# Patient Record
Sex: Female | Born: 1950 | Race: White | Hispanic: No | Marital: Married | Smoking: Current every day smoker
Health system: Southern US, Community
[De-identification: ages and names within clinical notes are randomized; demographics above are authoritative.]

## PROBLEM LIST (undated history)

## (undated) DIAGNOSIS — I2699 Other pulmonary embolism without acute cor pulmonale: Secondary | ICD-10-CM

## (undated) DIAGNOSIS — E538 Deficiency of other specified B group vitamins: Secondary | ICD-10-CM

## (undated) DIAGNOSIS — T7840XA Allergy, unspecified, initial encounter: Secondary | ICD-10-CM

## (undated) DIAGNOSIS — D682 Hereditary deficiency of other clotting factors: Secondary | ICD-10-CM

## (undated) DIAGNOSIS — F419 Anxiety disorder, unspecified: Secondary | ICD-10-CM

## (undated) DIAGNOSIS — Z9109 Other allergy status, other than to drugs and biological substances: Secondary | ICD-10-CM

## (undated) DIAGNOSIS — I6523 Occlusion and stenosis of bilateral carotid arteries: Secondary | ICD-10-CM

## (undated) DIAGNOSIS — H409 Unspecified glaucoma: Secondary | ICD-10-CM

## (undated) DIAGNOSIS — K219 Gastro-esophageal reflux disease without esophagitis: Secondary | ICD-10-CM

## (undated) DIAGNOSIS — E785 Hyperlipidemia, unspecified: Secondary | ICD-10-CM

## (undated) DIAGNOSIS — E079 Disorder of thyroid, unspecified: Secondary | ICD-10-CM

## (undated) DIAGNOSIS — F32A Depression, unspecified: Secondary | ICD-10-CM

## (undated) DIAGNOSIS — F329 Major depressive disorder, single episode, unspecified: Secondary | ICD-10-CM

## (undated) DIAGNOSIS — I639 Cerebral infarction, unspecified: Secondary | ICD-10-CM

## (undated) DIAGNOSIS — D689 Coagulation defect, unspecified: Secondary | ICD-10-CM

## (undated) DIAGNOSIS — I6350 Cerebral infarction due to unspecified occlusion or stenosis of unspecified cerebral artery: Secondary | ICD-10-CM

## (undated) HISTORY — DX: Cerebral infarction, unspecified: I63.9

## (undated) HISTORY — DX: Anxiety disorder, unspecified: F41.9

## (undated) HISTORY — PX: TONSILLECTOMY: SUR1361

## (undated) HISTORY — PX: BONE MARROW BIOPSY: SHX199

## (undated) HISTORY — DX: Unspecified glaucoma: H40.9

## (undated) HISTORY — DX: Allergy, unspecified, initial encounter: T78.40XA

## (undated) HISTORY — PX: INTRAOCULAR LENS INSERTION: SHX110

## (undated) HISTORY — DX: Hereditary deficiency of other clotting factors: D68.2

## (undated) HISTORY — PX: BUNIONECTOMY: SHX129

## (undated) HISTORY — DX: Deficiency of other specified B group vitamins: E53.8

## (undated) HISTORY — DX: Gastro-esophageal reflux disease without esophagitis: K21.9

## (undated) HISTORY — DX: Occlusion and stenosis of bilateral carotid arteries: I65.23

## (undated) HISTORY — DX: Hyperlipidemia, unspecified: E78.5

## (undated) HISTORY — DX: Coagulation defect, unspecified: D68.9

## (undated) HISTORY — DX: Other pulmonary embolism without acute cor pulmonale: I26.99

## (undated) HISTORY — DX: Cerebral infarction due to unspecified occlusion or stenosis of unspecified cerebral artery: I63.50

---

## 2007-10-16 ENCOUNTER — Encounter: Payer: Self-pay | Admitting: Cardiology

## 2008-01-25 DIAGNOSIS — R413 Other amnesia: Secondary | ICD-10-CM | POA: Insufficient documentation

## 2010-07-21 ENCOUNTER — Ambulatory Visit
Admit: 2010-07-21 | Discharge: 2010-07-21 | Disposition: A | Discharge status: Home / Self Care | Payer: Self-pay | Source: Ambulatory Visit | Attending: Ophthalmology | Admitting: Ophthalmology

## 2010-07-21 LAB — COMPREHENSIVE METABOLIC PANEL
ALT: 23 U/L (ref 0–35)
AST: 24 U/L (ref 0–35)
Albumin: 4.6 g/dL (ref 3.5–5.2)
Alk Phos: 73 U/L (ref 35–105)
Anion Gap: 10 (ref 7–16)
Bilirubin,Total: 0.2 mg/dL (ref 0.0–1.2)
CO2: 29 mmol/L — ABNORMAL HIGH (ref 20–28)
Calcium: 9.5 mg/dL (ref 8.6–10.2)
Chloride: 101 mmol/L (ref 96–108)
Creatinine: 0.79 mg/dL (ref 0.51–0.95)
GFR,Black: >59 *
GFR,Caucasian: >59 *
Glucose: 126 mg/dL — ABNORMAL HIGH (ref 74–106)
Lab: 12 mg/dL (ref 6–20)
Potassium: 4.5 mmol/L (ref 3.3–5.1)
Sodium: 140 mmol/L (ref 133–145)
Total Protein: 6.9 g/dL (ref 6.3–7.7)

## 2010-07-21 LAB — CBC AND DIFFERENTIAL
Baso # K/uL: 0 THOU/uL (ref 0.0–0.1)
Basophil %: 0.3 % (ref 0.1–1.2)
Eos # K/uL: 0.1 THOU/uL (ref 0.0–0.4)
Eosinophil %: 1.1 % (ref 0.7–5.8)
Hematocrit: 41 % (ref 34–45)
Hemoglobin: 13.7 g/dL (ref 11.2–15.7)
Lymph # K/uL: 2.2 THOU/uL (ref 1.2–3.7)
Lymphocyte %: 29.1 % (ref 19.3–51.7)
MCV: 100 fL — ABNORMAL HIGH (ref 79–95)
Mono # K/uL: 0.6 THOU/uL (ref 0.2–0.9)
Monocyte %: 8.1 % (ref 4.7–12.5)
Neut # K/uL: 4.6 THOU/uL (ref 1.6–6.1)
Platelets: 261 THOU/uL (ref 160–370)
RBC: 4.1 MIL/uL (ref 3.9–5.2)
RDW: 14 % (ref 11.7–14.4)
Seg Neut %: 61.4 % (ref 34.0–71.1)
WBC: 7.5 THOU/uL (ref 4.0–10.0)

## 2010-07-21 LAB — URINALYSIS REFLEX TO CULTURE
Blood,UA: NEGATIVE
Ketones, UA: NEGATIVE
Leuk Esterase,UA: NEGATIVE
Nitrite,UA: NEGATIVE
Protein,UA: NEGATIVE mg/dL

## 2010-07-21 LAB — HIGH SENSITIVITY CRP: CRP,High Sensitivity: 1.9 mg/L

## 2010-07-21 LAB — URINALYSIS WITH REFLEX TO CULTURE
Specific Gravity,UA: 1.011 (ref 1.002–1.030)
pH,UA: 5.5 (ref 5.0–8.0)

## 2010-07-21 LAB — SEDIMENTATION RATE, AUTOMATED: Sedimentation Rate: 25 mm/h (ref 0–30)

## 2010-07-21 LAB — PROTIME-INR
INR: 2.4 — ABNORMAL HIGH (ref 0.9–1.1)
Protime: 26.6 s — ABNORMAL HIGH (ref 11.9–14.7)

## 2010-07-21 LAB — TSH: TSH: 3.59 u[IU]/mL (ref 0.27–4.20)

## 2010-07-21 LAB — CK: CK: 87 U/L (ref 34–145)

## 2010-07-21 LAB — APTT: aPTT: 51.8 s — ABNORMAL HIGH (ref 22.3–35.3)

## 2010-07-22 LAB — ANCA SCREEN: ANCA Screen: NEGATIVE

## 2010-07-22 LAB — ANA TITER/PATTERN: ANA Titer: 80 — AB

## 2010-07-22 LAB — RHEUMATOID FACTOR,SCREEN: Rheumatoid Factor: <10 IU/mL

## 2010-07-22 LAB — LYME IGG/IGM AB: Lyme AB Screen: NEGATIVE

## 2010-07-22 LAB — ANTINUCLEAR ANTIBODY SCREEN: ANA Screen: POSITIVE — AB

## 2010-07-22 LAB — ANTI-CARDIOLIPIN AB
Anticardiolipin IgG: 4 [GPL'U]/mL (ref 0–14)
Anticardiolipin IgM: 48 [MPL'U]/mL — ABNORMAL HIGH (ref 0–12)

## 2010-07-23 LAB — PROTEIN ELECTROPHORESIS, SERUM
A/G Ratio: 1.9 — ABNORMAL HIGH (ref 0.9–1.8)
Albumin %: 65.5 % (ref 55.3–68.5)
Albumin: 4.5 g/dL (ref 3.2–5.6)
Alpha 1 %: 2.3 % (ref 1.3–3.3)
Alpha 1: 0.2 g/dL (ref 0.1–0.3)
Alpha 2 %: 9.7 % (ref 7.7–13.7)
Alpha 2: 0.7 g/dL (ref 0.4–1.1)
Beta %: 11.4 % (ref 9.1–15.1)
Beta: 0.8 g/dL (ref 0.5–1.2)
Gamma %: 11.1 % (ref 8.6–17.8)
Gamma: 0.8 g/dL (ref 0.5–1.5)

## 2010-07-23 LAB — ANGIOTENSIN CONVERTING ENZYME: Angiotensin 1 Conv Enzyme: 44 U/L (ref 9–67)

## 2010-07-26 LAB — HSV 2 ANTIBODY, IGG: HSV 2 IgG: 0.25 IV

## 2010-07-26 LAB — C1Q BINDING IMMUNE COMPLEX: C1Q Binding: 3.3 ugE/mL (ref 0.0–3.9)

## 2010-07-26 LAB — HSV 1 ANTIBODY, IGG: HSV 1 IgG: 9.06 IV

## 2010-07-27 ENCOUNTER — Other Ambulatory Visit: Payer: Self-pay | Admitting: Ophthalmology

## 2010-08-03 ENCOUNTER — Ambulatory Visit
Admit: 2010-08-03 | Discharge: 2010-08-03 | Disposition: A | Discharge status: Home / Self Care | Payer: Self-pay | Source: Ambulatory Visit | Attending: Ophthalmology | Admitting: Ophthalmology

## 2010-08-04 LAB — INTERPRETATION,SPEC COAG

## 2010-08-04 LAB — LUPUS ANTICOAGULANT: Lupus Anticoagulant: NEGATIVE

## 2010-08-04 LAB — REVIEWED BY:

## 2010-08-05 LAB — PHOSPHATIDYLCHOLINE IGG/IGM/IGA
Phosphatidylcholine IgA: 2 U/mL (ref 0–18)
Phosphatidylcholine IgG: 0 U/mL (ref 0–18)
Phosphatidylcholine IgM: 1 U/mL (ref 0–18)

## 2010-08-07 LAB — PHOSPHATIDYLSERINE ANTIBODIES
Phos Serine IgA: >100 U/mL — ABNORMAL HIGH (ref 0–19)
Phos Serine IgG: 18 U/mL — ABNORMAL HIGH (ref 0–10)
Phos Serine IgM: 76 U/mL — ABNORMAL HIGH (ref 0–24)

## 2010-08-10 LAB — ANTI-CARDIOLIPIN AB
Anticardiolipin IgG: 6 [GPL'U]/mL (ref 0–14)
Anticardiolipin IgM: 66 [MPL'U]/mL — ABNORMAL HIGH (ref 0–12)

## 2010-09-15 ENCOUNTER — Ambulatory Visit: Payer: Self-pay

## 2010-09-15 VITALS — BP 152/75 | HR 89 | Temp 97.5°F | Resp 16 | Ht 64.57 in | Wt 138.9 lb

## 2010-09-15 DIAGNOSIS — I2699 Other pulmonary embolism without acute cor pulmonale: Secondary | ICD-10-CM

## 2010-09-17 NOTE — H&P (Signed)
 CC: management of hypercoagulability  HPI:  This is a  59 yo WM with suffered a PE and CVA about 2 years ago.  The patient recalls walking in the mall with her husband when one leg stopped responding normally. She had a scheduled appt with her PCP two days later.  When she went to that appointment, her PCP recommended emergency room evaluation (this was in [place]).  Her work up revealed bilateral PE's.  She was treated with heparin and coumadin.  She does not recall need ICU level care or thrombolytics.  During her hospitalization, it was thought that she had also developed a CVA as her balance was affected.  The patient has been treated with coumadin since that time without adverse event.    Ms. Ose was recently seen by a neuro-opthamologist for facial pain and rising intraocular pressure.  Given her h/o venous and arterial clots, antiphospholipid and anit-serine antibodies were checked.  Anticardiolipin IgM was positive at 48, then 66 on recheck two weeks later.  Her facial symptoms are probably related to zoster and have improved on antiviral medications.    The patient largely feels well.  She reports intermittent chest discomfort that seems to come on when lying flat, but otherwise denies chest pain, shortness of breath or hemoptysis.  She denies f/c/s, unintentional weight loss, abdominal pain, bleeding/bruising, arthralgias, myalgias, headaches or visual changes.    PMH:  Factor V Leiden (presumed heterozygote, but the patient is unsure)  Bone marrow bx 10 years ago showing low B12  H/o shingles    SHx  Married, lives with husband  Smokes  No significant etoh or illicit drug use  HRT use 10 years ago- her PCP discontinued this when her factor V leiden status was discovered      FHx  M-died suddenly, positive for factor V leiden (unknown whether hetero- or homozygous)  F-gastric cancer  Twin sister- also positive for factor V leiden (unknown whether hetero-or homozygous), no h/o clots  1 daughter-healthy,  negative for factor V leiden  1son-healthy, factor V status unknown    PEx  Gen: nad  HEENT: at/nc, no OP lesions  CV: rrr  PULM: ctab  Abd: s/nt/nd/+bs  Extr: no edema, no ttp    A/P  59 yo WF with two of three criteria for antiphospholipid antibody syndrome- a history of venous and arterial clots and a positive antiphospholipid antibody (the third criterion is persistence of the antibody x 12 weeks).  It is not known whether the antiphospholipid antibody was present at the time of her original clots or whether they were unrelated, however, the patient has tolerated anticoagulation without adverse event.  Also contributing to hypercoagulability is her factor V Leiden status, which, presuming she is a heterozygote, confers an relative risk of recurrent clots of 7 (the RR of homozygosity is 65).      Overall, given the presence of anticardiolipin antibodies, factor V leiden mutational status and h/o venous and arterial clots in the absence of major bleeding on coumadin, we would recommend that the patient continue anticoagulation.      Her symptoms of chest discomfort are concerning for right heart strain given her history of PE's and smoking.  We would recommend that she follow up with her PCP for further evaluation.    We would be happy to reevaluate the patient for any further questions or issues, but will not schedule a routine appointment.    Philbert Riser  Hematology fellow    CC:  Loel Lofty, MD  Felicie Morn, MD

## 2013-08-15 ENCOUNTER — Encounter (HOSPITAL_COMMUNITY): Payer: Self-pay | Admitting: Emergency Medicine

## 2013-08-15 ENCOUNTER — Emergency Department (HOSPITAL_COMMUNITY)
Admission: EM | Admit: 2013-08-15 | Discharge: 2013-08-15 | Disposition: A | Discharge status: Home / Self Care | Payer: BC Managed Care – PPO | Attending: Emergency Medicine | Admitting: Emergency Medicine

## 2013-08-15 DIAGNOSIS — E079 Disorder of thyroid, unspecified: Secondary | ICD-10-CM | POA: Insufficient documentation

## 2013-08-15 DIAGNOSIS — Z760 Encounter for issue of repeat prescription: Secondary | ICD-10-CM | POA: Insufficient documentation

## 2013-08-15 DIAGNOSIS — F411 Generalized anxiety disorder: Secondary | ICD-10-CM | POA: Insufficient documentation

## 2013-08-15 DIAGNOSIS — Z86711 Personal history of pulmonary embolism: Secondary | ICD-10-CM | POA: Insufficient documentation

## 2013-08-15 DIAGNOSIS — F329 Major depressive disorder, single episode, unspecified: Secondary | ICD-10-CM | POA: Insufficient documentation

## 2013-08-15 DIAGNOSIS — Z7901 Long term (current) use of anticoagulants: Secondary | ICD-10-CM | POA: Insufficient documentation

## 2013-08-15 DIAGNOSIS — Z79899 Other long term (current) drug therapy: Secondary | ICD-10-CM | POA: Insufficient documentation

## 2013-08-15 DIAGNOSIS — F17200 Nicotine dependence, unspecified, uncomplicated: Secondary | ICD-10-CM | POA: Insufficient documentation

## 2013-08-15 DIAGNOSIS — F3289 Other specified depressive episodes: Secondary | ICD-10-CM | POA: Insufficient documentation

## 2013-08-15 DIAGNOSIS — G479 Sleep disorder, unspecified: Secondary | ICD-10-CM | POA: Insufficient documentation

## 2013-08-15 HISTORY — DX: Disorder of thyroid, unspecified: E07.9

## 2013-08-15 HISTORY — DX: Depression, unspecified: F32.A

## 2013-08-15 HISTORY — DX: Major depressive disorder, single episode, unspecified: F32.9

## 2013-08-15 HISTORY — DX: Other pulmonary embolism without acute cor pulmonale: I26.99

## 2013-08-15 HISTORY — DX: Other allergy status, other than to drugs and biological substances: Z91.09

## 2013-08-15 MED ORDER — ALPRAZOLAM 0.5 MG PO TABS: 0.5000 mg | ORAL_TABLET | Freq: Two times a day (BID) | ORAL | Status: DC

## 2013-08-15 MED ORDER — ALPRAZOLAM 0.5 MG PO TABS
0.5000 mg | ORAL_TABLET | Freq: Once | ORAL | Status: AC
  Administered 2013-08-15: 0.5 mg via ORAL
  Filled 2013-08-15: qty 1

## 2013-08-15 NOTE — ED Notes (Signed)
Pt is out of alprazolam , feels shakey, nervous, cannot sleep.

## 2013-08-15 NOTE — ED Notes (Signed)
Pt states recently moved here from [place], and is currently out of her medication. Pt reports has [place] and said RX cannot be refilled by him. Pt has an appointment next week at a local MD. Pt states feels jittery, difficulty sleeping and anxious at this particular time. Said pt's twin sister is hospitalized for MI currently, and feels is under additional stress from moving, sisters MI and newly moving out of state. Denies SI/HI. NAD noted

## 2013-08-16 NOTE — ED Provider Notes (Signed)
CSN: [id number]     Arrival date & time 08/15/13  1332 History   First MD Initiated Contact with Patient 08/15/13 1357     Chief Complaint  Patient presents with  . Medication Refill   (Consider location/radiation/quality/duration/timing/severity/associated sxs/prior Treatment) HPI Comments: Emily Roberson is a 62 y.o. Female presenting for assistance with medication refill.  She has moved to [place] during the past month and has run out of her alprazolam which she takes for treatment of anxiety.  She spoke to her physician in [place] who informed her this prescription cannot be refilled by him.  She does have an appointment to establish care with one of the physicians at [facility] here next week.  In the interim,  Her last dose of alprazolam was taken 3 days ago.  She has felt progressively anxious, jittery, and has been difficulty sleeping,  Possibly compounded by the bad new of her twin sister currently being hospitalized with an acute MI.  She denies suicidal ideation, homicidal ideation.  She is currently ok with her other medicines.      The history is provided by the patient and a relative.    Past Medical History  Diagnosis Date  . Depression   . Thyroid disease   . PE (pulmonary embolism)   . Environmental allergies    Past Surgical History  Procedure Laterality Date  . Tonsillectomy    . Bunionectomy     History reviewed. No pertinent family history. History  Substance Use Topics  . Smoking status: Current Every Day Smoker  . Smokeless tobacco: Not on file  . Alcohol Use: No   OB History   Grav Para Term Preterm Abortions TAB SAB Ect Mult Living                 Review of Systems  Constitutional: Negative for fever and chills.  HENT: Negative for congestion and sore throat.   Eyes: Negative.   Respiratory: Negative for chest tightness and shortness of breath.   Cardiovascular: Negative for chest pain.  Gastrointestinal: Negative for nausea and abdominal pain.   Genitourinary: Negative.   Musculoskeletal: Negative for arthralgias, joint swelling and neck pain.  Skin: Negative.  Negative for rash and wound.  Neurological: Negative for dizziness, weakness, light-headedness, numbness and headaches.  Psychiatric/Behavioral: Positive for sleep disturbance. Negative for suicidal ideas and self-injury. The patient is nervous/anxious.     Allergies  Ceftin; Contrast media; and Statins  Home Medications   Current Outpatient Rx  Name  Route  Sig  Dispense  Refill  . ALPRAZolam (XANAX) 0.5 MG tablet   Oral   Take 0.5 mg by mouth 2 (two) times daily.         Marland Kitchen b complex vitamins tablet   Oral   Take 1 tablet by mouth daily.         Marland Kitchen escitalopram (LEXAPRO) 20 MG tablet   Oral   Take 20 mg by mouth daily.         Marland Kitchen latanoprost (XALATAN) 0.005 % ophthalmic solution   Right Eye   Place 1 drop into the right eye at bedtime.         Marland Kitchen levothyroxine (SYNTHROID, LEVOTHROID) 25 MCG tablet   Oral   Take 25 mcg by mouth daily before breakfast.         . montelukast (SINGULAIR) 10 MG tablet   Oral   Take 10 mg by mouth at bedtime.         Marland Kitchen  Rivaroxaban (XARELTO) 15 MG TABS tablet   Oral   Take 15 mg by mouth daily with supper.         . ALPRAZolam (XANAX) 0.5 MG tablet   Oral   Take 1 tablet (0.5 mg total) by mouth 2 (two) times daily.   15 tablet   0    BP 150/73  Pulse 84  Temp(Src) 99 F (37.2 C) (Oral)  Resp 20  Ht 5\' 2"  (1.575 m)  Wt 135 lb (61.236 kg)  BMI 24.69 kg/m2  SpO2 98% Physical Exam  Nursing note and vitals reviewed. Constitutional: She appears well-developed and well-nourished. No distress.  Slightly anxious appearing female in no distress.   HENT:  Head: Normocephalic and atraumatic.  Eyes: Conjunctivae are normal.  Neck: Normal range of motion.  Cardiovascular: Normal rate, regular rhythm, normal heart sounds and intact distal pulses.   Pulmonary/Chest: Effort normal and breath sounds normal. She  has no wheezes.  Abdominal: Soft. Bowel sounds are normal. There is no tenderness.  Musculoskeletal: Normal range of motion.  Neurological: She is alert.  Skin: Skin is warm and dry.  Psychiatric: Her speech is normal and behavior is normal. Judgment and thought content normal. Her mood appears anxious. Cognition and memory are normal. She expresses no homicidal and no suicidal ideation.    ED Course  Procedures (including critical care time) Labs Review Labs Reviewed - No data to display Imaging Review No results found.  EKG Interpretation   None       MDM   1. Medication refill    Pt prescribed alprazolam 0.5 mg bid #15.  Stressed need to establish with pcp next week as planned.  Pt agrees.  The patient appears reasonably screened and/or stabilized for discharge and I doubt any other medical condition or other [facility] requiring further screening, evaluation, or treatment in the ED at this time prior to discharge.     Burgess Amor, PA-C 08/16/13 2229

## 2013-08-20 ENCOUNTER — Other Ambulatory Visit: Payer: Self-pay | Admitting: *Deleted

## 2013-08-20 ENCOUNTER — Ambulatory Visit (INDEPENDENT_AMBULATORY_CARE_PROVIDER_SITE_OTHER): Payer: BC Managed Care – PPO | Admitting: Family Medicine

## 2013-08-20 VITALS — BP 120/62 | Temp 97.7°F | Ht 63.0 in | Wt 135.4 lb

## 2013-08-20 DIAGNOSIS — G250 Essential tremor: Secondary | ICD-10-CM | POA: Insufficient documentation

## 2013-08-20 DIAGNOSIS — E039 Hypothyroidism, unspecified: Secondary | ICD-10-CM

## 2013-08-20 DIAGNOSIS — F411 Generalized anxiety disorder: Secondary | ICD-10-CM

## 2013-08-20 DIAGNOSIS — E785 Hyperlipidemia, unspecified: Secondary | ICD-10-CM

## 2013-08-20 DIAGNOSIS — F329 Major depressive disorder, single episode, unspecified: Secondary | ICD-10-CM | POA: Insufficient documentation

## 2013-08-20 DIAGNOSIS — F32A Depression, unspecified: Secondary | ICD-10-CM | POA: Insufficient documentation

## 2013-08-20 DIAGNOSIS — H409 Unspecified glaucoma: Secondary | ICD-10-CM

## 2013-08-20 MED ORDER — ALPRAZOLAM 0.5 MG PO TABS: ORAL_TABLET | ORAL | Status: DC

## 2013-08-20 NOTE — Progress Notes (Signed)
Subjective:    Patient ID: Emily Roberson, female    DOB: 1951/03/27, 62 y.o.   MRN: [id number]  HPI Comments: Emily Roberson is a 62 y.o WF here as a new patient. She is from [facility] and has been here for 3 weeks.   She is here for refills on her anxiety medication. She went to the ED and they only gave her 15 tabs and told her to follow up with a PCP. She has been compliant with this medication. She denies any withdrawal symptoms and her last dose was a day ago. She says she has been weaning off of these and she use to take one tab 4 times a day but now only does twice a day. She says she's been doing well with this and would like to continue this taper if she could. She says she wasn't told about other options for her anxiety. She says she's always done what they told her to.  She has a PMH of bilateral PE's and CVA. She is on xarelto now for this. She was on warfarin but stopped this due to complications and frequent monitoring. She was also hard to control with therapeutic INR. She is doing well on this and has no signs of bleeding.   She also has a hx of depression and anxiety. She had a friend that died some years ago and  Had an episode where she was depressed and had phobias of leaving her  Home. Her prior PCP in [place] placed her on lexapro and xanax. She has been on these for years. She says she takes her xanax bid no matter what. She says this helps her and helps her sleep at night.   She also has hypothyroidism and is on synthroid daily. She has been on this dose for years but the last TSH reading went up from 2 to 3 in April and July 2014. She says she wasn't aware of this and never had her synthroid dose changed. She has been feeling tired and drained lately. She denies any constipation or heart palpitations. She does have some heat intolerance.   She has right eye glaucoma of which she is on xalatan for . She has been on this for years. She use to see an opthalmologist in [place] but doesn't see  any since being in [place].  She would like a referral to see  And be established with one here. She denies any problems with her eye sight and doesn't have any blurred vision.   She also has a hx of essential familial tremor and she hasn't been on anything for this. She says she was told that there's nothing they can do. She also says it doesn't occur all the time and that she only has a tremor when she's anxious or nervous about something.   PMH: PE bilateral 5 years ago, CVA, hypothyroidism, depression/anxiety, glaucoma Medications: see list Surgeries: none Allergies:  Ceftin, contrast, keflex, statins       Review of Systems  Constitutional: Positive for fatigue and unexpected weight change. Negative for diaphoresis, activity change and appetite change.  HENT: Negative for hearing loss, postnasal drip, sinus pressure and trouble swallowing.   Eyes: Negative for pain, redness and visual disturbance.  Respiratory: Negative for chest tightness, shortness of breath and wheezing.   Cardiovascular: Negative for chest pain and palpitations.  Gastrointestinal: Negative for nausea, vomiting, abdominal pain, diarrhea and constipation.  Endocrine: Positive for heat intolerance. Negative for cold intolerance, polydipsia and polyuria.  Genitourinary: Negative for dysuria, urgency, frequency, flank pain and pelvic pain.  Musculoskeletal: Negative for arthralgias and joint swelling.  Skin: Negative for color change and wound.  Allergic/Immunologic: Positive for environmental allergies. Negative for immunocompromised state.  Neurological: Negative for dizziness, speech difficulty, weakness, light-headedness, numbness and headaches.  Psychiatric/Behavioral: Negative for suicidal ideas, hallucinations, behavioral problems, sleep disturbance, self-injury and agitation. The patient is nervous/anxious.        Objective:   Physical Exam  Nursing note and vitals reviewed. Constitutional: She is oriented to  person, place, and time. She appears well-developed and well-nourished.  HENT:  Head: Normocephalic and atraumatic.  Right Ear: External ear normal.  Left Ear: External ear normal.  Mouth/Throat: Oropharynx is clear and moist.  Eyes: Conjunctivae are normal. Pupils are equal, round, and reactive to light.  Neck: Normal range of motion. Neck supple. No thyromegaly present.  Cardiovascular: Normal rate, regular rhythm, normal heart sounds and intact distal pulses.   Pulmonary/Chest: Effort normal and breath sounds normal. No respiratory distress. She has no wheezes. She exhibits no tenderness.  Abdominal: Soft. She exhibits no distension. There is no tenderness. There is no guarding.  Musculoskeletal: Normal range of motion. She exhibits no edema.  Neurological: She is alert and oriented to person, place, and time.  Skin: Skin is warm and dry.  Psychiatric: She has a normal mood and affect. Her behavior is normal. Thought content normal.      Assessment & Plan:  Emily Roberson was seen today for establish care.  Diagnoses and associated orders for this visit:  Generalized anxiety disorder - ALPRAZolam (XANAX) 0.5 MG tablet; Take one tab by mouth daily for 14 days.  Depression - Cancel: TSH + free T4; Future - Cancel: TSH + free T4  Familial tremor  Unspecified hypothyroidism - Cancel: TSH + free T4; Future - Cancel: TSH + free T4 - TSH; Future - T4, free; Future  HLD (hyperlipidemia)  Glaucoma - Ambulatory referral to Ophthalmology   although policy here is not to write chronic anxiolytics or chronic pain medications. The patient is wanting to try to wean off of this and since she's been on this for many years. I don't feel that it is safe to stop this altogether. Will go from xanax BID to qd and see how she does. Will see back in 2 weeks for follow up of this.  She has hypothyroidism and has been on the same dose of her synthroid for many years. She has been noted to have rising  TSH and will check this and increase dose of synthroid if indicated.   Refer to opthal for glaucoma. She is to continue drops for now.   Will get blood work again in Jan 2015 with her wellness. She just had her routine labs done Jan 2014.  She is noted to have HLD but diet may control this. The values weren't terribly elevated.   Tremor isn't noted today. HR is 80s and don't want to cause bradycardia so will hold off on BB for the treatment of her tremor for now.

## 2013-08-20 NOTE — Patient Instructions (Signed)
Temporomandibular Joint Pain Your exam shows that you have a problem with your temporomandibular joint (TMJ), the joint that moves when you open your mouth or chew food. TMJ problems can result from direct injuries, bite abnormalities, or tension states which cause you to grind or clench your teeth. Typical symptoms include pain around the joint, clicking, restricted movement, and headaches. The TMJ is like any other joint in the body; when it is strained, it needs rest to repair itself. To keep the joint at rest it is important that you do not open your mouth wider than the width of your index finger. If you must yawn, be sure to support your chin with your hand so your mouth does not open wide. Eat a soft diet (nothing firmer than ground beef, no raw vegetables), do not chew gum and do not talk if it causes you pain. Apply topical heat by using a warm, moist cloth placed in front of the ear for 15 to 20 minutes several times daily. Alternating heat and ice may give even more relief. Anti-inflammatory pain medicine and muscle relaxants can also be helpful. A dental orthotic or splint may be used for temporary relief. Long-term problems may require treatment for stress as well as braces or surgery. Please check with your doctor or dentist if your symptoms do not improve within one week. Document Released: 12/01/2004 Document Revised: 01/16/2012 Document Reviewed: 10/24/2005 [facility] Patient Information 2014 [place], [place]. Hypothyroidism The thyroid is a large gland located in the lower front of your neck. The thyroid gland helps control metabolism. Metabolism is how your body handles food. It controls metabolism with the hormone thyroxine. When this gland is underactive (hypothyroid), it produces too little hormone.  CAUSES These include:   Absence or destruction of thyroid tissue.  Goiter due to iodine deficiency.  Goiter due to medications.  Congenital defects (since birth).  Problems with the  pituitary. This causes a lack of TSH (thyroid stimulating hormone). This hormone tells the thyroid to turn out more hormone. SYMPTOMS  Lethargy (feeling as though you have no energy)  Cold intolerance  Weight gain (in spite of normal food intake)  Dry skin  Coarse hair  Menstrual irregularity (if severe, may lead to infertility)  Slowing of thought processes Cardiac problems are also caused by insufficient amounts of thyroid hormone. Hypothyroidism in the newborn is cretinism, and is an extreme form. It is important that this form be treated adequately and immediately or it will lead rapidly to retarded physical and mental development. DIAGNOSIS  To prove hypothyroidism, your caregiver may do blood tests and ultrasound tests. Sometimes the signs are hidden. It may be necessary for your caregiver to watch this illness with blood tests either before or after diagnosis and treatment. TREATMENT  Low levels of thyroid hormone are increased by using synthetic thyroid hormone. This is a safe, effective treatment. It usually takes about four weeks to gain the full effects of the medication. After you have the full effect of the medication, it will generally take another four weeks for problems to leave. Your caregiver may start you on low doses. If you have had heart problems the dose may be gradually increased. It is generally not an emergency to get rapidly to normal. HOME CARE INSTRUCTIONS   Take your medications as your caregiver suggests. Let your caregiver know of any medications you are taking or start taking. Your caregiver will help you with dosage schedules.  As your condition improves, your dosage needs may increase.  It will be necessary to have continuing blood tests as suggested by your caregiver.  Report all suspected medication side effects to your caregiver. SEEK MEDICAL CARE IF: Seek medical care if you develop:  Sweating.  Tremulousness (tremors).  Anxiety.  Rapid  weight loss.  Heat intolerance.  Emotional swings.  Diarrhea.  Weakness. SEEK IMMEDIATE MEDICAL CARE IF:  You develop chest pain, an irregular heart beat (palpitations), or a rapid heart beat. MAKE SURE YOU:   Understand these instructions.  Will watch your condition.  Will get help right away if you are not doing well or get worse. Document Released: 10/24/2005 Document Revised: 01/16/2012 Document Reviewed: 06/13/2008 [facility] Patient Information 2014 [place], [place].

## 2013-08-20 NOTE — ED Provider Notes (Signed)
Medical screening examination/treatment/procedure(s) were performed by non-physician practitioner and as supervising physician I was immediately available for consultation/collaboration.   Asar Evilsizer J Anie Juniel, MD 08/20/13 1620 

## 2013-08-21 ENCOUNTER — Encounter: Payer: Self-pay | Admitting: Family Medicine

## 2013-08-21 ENCOUNTER — Other Ambulatory Visit: Payer: Self-pay | Admitting: Family Medicine

## 2013-08-21 DIAGNOSIS — E039 Hypothyroidism, unspecified: Secondary | ICD-10-CM | POA: Insufficient documentation

## 2013-08-21 DIAGNOSIS — H409 Unspecified glaucoma: Secondary | ICD-10-CM | POA: Insufficient documentation

## 2013-08-21 MED ORDER — LEVOTHYROXINE SODIUM 50 MCG PO TABS: 50.0000 ug | ORAL_TABLET | Freq: Every day | ORAL | Status: DC

## 2013-08-21 NOTE — Progress Notes (Signed)
Spoke to patient and relayed results of TSH. Would like to go up to 50 mcg daily. She has 25 mcg #94 left and so I've instructed her to take 2 tabs daily of this. This would last her at least 45 days and that will be the time period where she willneed to return for repeat TSH. She voiced understanding and will also follow up in 2 weeks for anxiety medications. We are weaning her off the xanax

## 2013-09-03 ENCOUNTER — Encounter: Payer: Self-pay | Admitting: Family Medicine

## 2013-09-03 ENCOUNTER — Ambulatory Visit (INDEPENDENT_AMBULATORY_CARE_PROVIDER_SITE_OTHER): Payer: BC Managed Care – PPO | Admitting: Family Medicine

## 2013-09-03 VITALS — BP 120/66 | HR 95 | Temp 97.8°F | Resp 17 | Ht 63.0 in | Wt 136.5 lb

## 2013-09-03 DIAGNOSIS — M7041 Prepatellar bursitis, right knee: Secondary | ICD-10-CM

## 2013-09-03 DIAGNOSIS — Z86711 Personal history of pulmonary embolism: Secondary | ICD-10-CM

## 2013-09-03 DIAGNOSIS — E039 Hypothyroidism, unspecified: Secondary | ICD-10-CM

## 2013-09-03 DIAGNOSIS — D682 Hereditary deficiency of other clotting factors: Secondary | ICD-10-CM | POA: Insufficient documentation

## 2013-09-03 DIAGNOSIS — F411 Generalized anxiety disorder: Secondary | ICD-10-CM

## 2013-09-03 DIAGNOSIS — R760 Raised antibody titer: Secondary | ICD-10-CM

## 2013-09-03 DIAGNOSIS — R894 Abnormal immunological findings in specimens from other organs, systems and tissues: Secondary | ICD-10-CM

## 2013-09-03 DIAGNOSIS — M7040 Prepatellar bursitis, unspecified knee: Secondary | ICD-10-CM

## 2013-09-03 DIAGNOSIS — K219 Gastro-esophageal reflux disease without esophagitis: Secondary | ICD-10-CM

## 2013-09-03 DIAGNOSIS — Z8669 Personal history of other diseases of the nervous system and sense organs: Secondary | ICD-10-CM

## 2013-09-03 DIAGNOSIS — G500 Trigeminal neuralgia: Secondary | ICD-10-CM

## 2013-09-03 DIAGNOSIS — R42 Dizziness and giddiness: Secondary | ICD-10-CM

## 2013-09-03 LAB — BASIC METABOLIC PANEL
BUN: 11 mg/dL (ref 6–23)
CO2: 31 mEq/L (ref 19–32)
Chloride: 102 mEq/L (ref 96–112)
Creat: 0.78 mg/dL (ref 0.50–1.10)
Glucose, Bld: 89 mg/dL (ref 70–99)
Potassium: 4.9 mEq/L (ref 3.5–5.3)
Sodium: 140 mEq/L (ref 135–145)

## 2013-09-03 MED ORDER — ALPRAZOLAM 0.5 MG PO TABS: ORAL_TABLET | ORAL | Status: DC

## 2013-09-03 NOTE — Patient Instructions (Addendum)
Meniere's Disease  You have Meniere's disease. Meniere's disease is a term for the recurrent symptoms (problems) of vertigo (the room or you seem to spin around), tinnitus (ringing in the ears), and hearing loss. The cause is unknown. These episodes often come on suddenly and without warning. They are sometimes associated with nausea (feeling sick to your stomach) and vomiting. There is no cure. A number of strategies may help the symptoms. Surgical help is available if conservative measures fail.  HOME CARE INSTRUCTIONS   · If you smoke, STOP.  · Restrict your salt intake.  · Stop caffeine consumption (caffeinated coffee, sodas, and chocolate).  · Do not drive during or near the time of attacks.  · Over the counter Antivert® (meclizine®) at a 25 mg dosage 3 times per day may be helpful.  SEEK IMMEDIATE MEDICAL CARE IF:   · Nausea and vomiting are continuous.  · You have no relief from vertigo.  MAKE SURE YOU:   · Understand these instructions.  · Will watch your condition.  · Will get help right away if you are not doing well or get worse.  Document Released: 10/21/2000 Document Revised: 01/16/2012 Document Reviewed: 10/24/2005  [employer]® Patient Information ©2014 [employer].

## 2013-09-03 NOTE — Progress Notes (Signed)
Subjective:    Patient ID: Leanora Cover, female    DOB: Aug 17, 1951, 62 y.o.   MRN: [id number]  HPI Comments: Kerryann is a 62 y.o WF here for follow up. She was seen initially 2 weeks ago to establish care. She mentioned a hx of depression/anxiety, hypothyroidism, allergies, glaucoma, and familial tremor.   She had been on xanax for this anxiety.  She was being weaned off by me and is on xanax 0.5mg  daily for 2 weeks. She has been doing well since being on this. She only had to take 1 tab a day for her anxiety and says she's been doing well with this.   She has reported dizziness for the last few days. Mostly this is only when she was gardening and go up from the ground. She says they last for a few minutes. She explains that it's a whole body aura and she feels funny. SHe reports that she had lesions on her brain scan a year ago. She had a possible stroke due to left sided facial droop back in [place].  She also says she has ringing in her ears but no blurred vision. She reports some previous pain in her left temporal region and near her jaw. During the first initial visit, I suspected TMJ.  She never mentioned to me all these previous work ups of the complaints she reports today. Last labs were ESR 26 normal range in Dec 2013,Mag 2.2 normal range, CMP normal, MCV 100.1, H/H 13.6 and 39.9.  I've reviewed her notes from Neurology after her office visit today,  while she was in [place]. It states she has had chronic tinnitis and hx of trigeminal neuralgia. 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 Further follow ups mentioned continued jaw and facial pain with the ringing in her ears. They also mentioned that due to her factor v leiden deficiency and PE's along with CVA, she may have a PFO. Her bubble ECHO was  canceled but it was felt that identifying a PFO wouldn't change management. So she never got this ECHO.   She has hypothyroidism and had TSH which was high. I went up on her synthroid form to daily. She is to return in about 4 weeks for repeat TSH.        Review of Systems  Constitutional: Negative for fever, appetite change, fatigue and unexpected weight change.  HENT: Negative for rhinorrhea, sneezing, sore throat and voice change.   Eyes: Negative for redness and visual disturbance.  Respiratory: Negative for cough and wheezing.   Gastrointestinal: Negative for vomiting, abdominal pain, diarrhea, constipation and blood in stool.  Endocrine: Negative for cold intolerance, heat intolerance, polydipsia and polyuria.  Genitourinary: Negative for dysuria, flank pain and difficulty urinating.  Musculoskeletal: Negative for gait problem and joint swelling.  Neurological: Negative for seizures, syncope, weakness, numbness and headaches.  Psychiatric/Behavioral: Negative for sleep disturbance and dysphoric mood.       Objective:   Physical Exam  Nursing note and vitals reviewed. Constitutional: She is oriented to person, place, and time. She appears well-developed and well-nourished.  Resting head tremor  HENT:  Head: Normocephalic and atraumatic.  Right Ear: External ear normal.  Left Ear: External ear normal.  Nose: Nose normal.  Mouth/Throat: Oropharynx is clear and moist.  Cardiovascular: Normal rate,  regular rhythm, normal heart sounds and intact distal pulses.   Orthostatic vital signs normal  Pulmonary/Chest: Effort normal and breath sounds normal. No respiratory distress. She has no wheezes.  Abdominal: Soft. Bowel sounds are normal. She exhibits no distension. There is no tenderness.  Musculoskeletal: Normal range of motion.  Some left hand grip strength decreased to 4/5 compared to 5/5 on the right  Neurological: She is alert and oriented to person, place, and  time. She has normal reflexes. No cranial nerve deficit. She exhibits normal muscle tone.  Skin: Skin is warm and dry. No rash noted. No erythema.  Psychiatric: She has a normal mood and affect. Her behavior is normal. Judgment and thought content normal.      Assessment & Plan:  Bernisha was seen today for follow-up and anxiety.  Diagnoses and associated orders for this visit:  Generalized anxiety disorder - ALPRAZolam (XANAX) 0.5 MG tablet; Take one tab by mouth daily as needed for anxiety  Postural dizziness - Orthostatic vital signs - Sedimentation Rate; Future - C-reactive protein; Future - Basic metabolic panel; Future - Sedimentation Rate - C-reactive protein - Basic metabolic panel  Unspecified hypothyroidism  Trigeminal neuralgia  H/O tinnitus  GERD (gastroesophageal reflux disease)  Hx pulmonary embolism  Factor V deficiency  Anticardiolipin antibody positive  Prepatellar bursitis of right knee  -after review of the previous work up by neurology, will repeat ESR.CRP and if changed from previous value, may warrant further investigation. Recent MRI and CT scan negative.  Orthostatic vital signs are normal. Dizziness and tinnitis was first thought of from possible meneire's dz but after review, it appears that this is chronic in nature.  -will continue xanax daily prn and continue to wean off. Xanax #30 given today with no refills.  -follow up in 4 weeks and will have repeat TSH at that visit.   -other medical issues updated in chart based on prior records.

## 2013-09-04 ENCOUNTER — Encounter: Payer: Self-pay | Admitting: Family Medicine

## 2013-09-12 ENCOUNTER — Other Ambulatory Visit: Payer: Self-pay

## 2013-09-17 ENCOUNTER — Encounter: Payer: Self-pay | Admitting: Family Medicine

## 2013-09-17 ENCOUNTER — Ambulatory Visit (INDEPENDENT_AMBULATORY_CARE_PROVIDER_SITE_OTHER): Payer: BC Managed Care – PPO | Admitting: Family Medicine

## 2013-09-17 VITALS — BP 116/68 | HR 98 | Temp 98.8°F | Resp 20 | Ht 62.0 in | Wt 135.2 lb

## 2013-09-17 DIAGNOSIS — Z720 Tobacco use: Secondary | ICD-10-CM

## 2013-09-17 DIAGNOSIS — F17200 Nicotine dependence, unspecified, uncomplicated: Secondary | ICD-10-CM

## 2013-09-17 DIAGNOSIS — R05 Cough: Secondary | ICD-10-CM

## 2013-09-17 DIAGNOSIS — F411 Generalized anxiety disorder: Secondary | ICD-10-CM

## 2013-09-17 DIAGNOSIS — F4310 Post-traumatic stress disorder, unspecified: Secondary | ICD-10-CM | POA: Insufficient documentation

## 2013-09-17 DIAGNOSIS — E039 Hypothyroidism, unspecified: Secondary | ICD-10-CM

## 2013-09-17 MED ORDER — ALPRAZOLAM 0.5 MG PO TABS: ORAL_TABLET | ORAL | Status: DC

## 2013-09-17 MED ORDER — BENZONATATE 100 MG PO CAPS: 100.0000 mg | ORAL_CAPSULE | Freq: Three times a day (TID) | ORAL | Status: AC | PRN
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
## 2013-09-17 NOTE — Patient Instructions (Signed)
Posttraumatic Stress Disorder   Posttraumatic stress disorder (PTSD) is a mental disorder. It occurs after a traumatic event in your life. The traumatic events that cause PTSD are outside the range of normal human experience. Examples of these events include war, automobile accidents, natural disasters, rape, domestic violence, and violent crimes. Most people who experience these types of events are able to heal on their own. Those who do not heal develop PTSD. PTSD can happen to anyone at any age. However, people with a history of childhood abuse are at increased risk for developing PTSD.   SYMPTOMS   The traumatic event that causes PTSD must be a threat to life, cause serious injury, or involve sexual violence. The traumatic event is usually experienced directly by the person who develops PTSD. Sometimes PTSD occurs in people who witness traumas that occur to others or who hear about a trauma that occurs to a close family member or friend. The following behaviors are characteristic of people with PTSD:   People with PTSD re-experience the traumatic event in one or more of the following ways (intrusion symptoms):   Recurrent, unwanted distressing memories while awake.   Recurrent distressing dreams.   Sensations similar to those felt when the event originally occurred (flashbacks).   Intense or prolonged emotional distress, triggered by reminders of the trauma. This may include fear, horror, intense sadness, or anger.   Marked physical reactions, triggered by reminders of the trauma. This may include racing heart, shortness of breath, sweating, and shaking.   People with PTSD avoid thoughts, conversations, people, or activities that remind them of the traumatic event (avoidance symptoms).   People with PTSD have negative changes in their thinking and mood after the traumatic event. These changes include:   Inability to remember one or more significant aspects of the traumatic event (memory gaps).   Exaggerated  negative perceptions about themselves or others, such as believing that they are bad people or that no one can be trusted.   Unrealistic assignment of blame to themselves or others for the traumatic event.   Persistent negative emotional state, such as fear, horror, anger, sadness, guilt, or shame.   Markedly decreased interest or participation in significant activities.   A loss of connection with other people.   Inability to experience positive emotions, such as happiness or love.   People with PTSD are more sensitive to their environment and react more easily than others (hyperarousal overreactivity symptoms). These symptoms include:   Irritability, with angry outbursts toward other people or objects. The outbursts are easily triggered and may be verbal or physical.   Careless or self-destructive behavior. This may include reckless driving or drug use.   A feeling of being on edge, with increased alertness (hypervigilance).   Exaggerated reactions to stimuli, such as being easily startled.   Difficulty concentrating.   Difficulty sleeping.  PTSD symptoms may start soon after a frightening event or months or years later. They last at least 1 month or longer and can affect one or more areas of functioning, such as social or occupational functioning.   DIAGNOSIS   PTSD is diagnosed through an assessment by a mental health professional. You will be asked questions about the traumatic events in your life. You will also be asked about how these events have changed your thoughts, mood, behavior, and ability to function on a daily basis. You may be asked about your use of alcohol or drugs, which can make PTSD symptoms worse.   TREATMENT     Unlike many mental disorders, which require life-long management, PTSD is a curable condition. The goal of PTSD treatment is to neutralize the negative effects of the traumatic event on daily functioning, not erase the memory of the event. The following treatments may be prescribed to  reach this goal:   Medication Certain medications can reduce some PTSD symptoms. Intrusion symptoms and hyperarousal overactivity symptoms respond best to medications.   Counseling (talk therapy) Talk therapy with a mental health professional who is experienced in treating PTSD can help. Talk therapy can provide education, emotional support, and coping skills. Certain types of talk therapy that specifically target the traumatic events are the most effective treatment for PTSD:   Prolonged exposure therapy, which involves remembering and processing the traumatic event with a therapist in a safe environment until it no longer creates a negative emotional response.   Eye movement desensitization and reprocessing therapy, which involves the use of repetitive physical stimulation of the senses that alternates between the right and left sides of the body. It is believed that this therapy facilitates communication between the two sides of the brain. This communication helps the mind to integrate the fragmented memories of the traumatic event into a whole story that makes sense and no longer creates a negative emotional response.  Most people with PTSD benefit from a combination of these treatments.   Document Released: 07/19/2001 Document Revised: 02/18/2013 Document Reviewed: 01/10/2013   [employer]® Patient Information ©2014 [employer].

## 2013-09-18 ENCOUNTER — Encounter: Payer: Self-pay | Admitting: Family Medicine

## 2013-10-01 LAB — T4, FREE: Free T4: 1 ng/dL (ref 0.80–1.80)

## 2013-10-01 LAB — TSH: TSH: 1.811 u[IU]/mL (ref 0.350–4.500)

## 2013-10-02 ENCOUNTER — Encounter: Payer: Self-pay | Admitting: Family Medicine

## 2013-10-07 ENCOUNTER — Encounter: Payer: Self-pay | Admitting: Family Medicine

## 2013-10-07 NOTE — Telephone Encounter (Signed)
If you could get these records for me to review and we can investigate further. There are a few muscle heart enzymes that are present so this is a nonspecific finding. This is why I need these records and we can go to the next step with stress test if indicated based on these values.

## 2013-10-08 ENCOUNTER — Other Ambulatory Visit: Payer: Self-pay | Admitting: Family Medicine

## 2013-10-08 DIAGNOSIS — D682 Hereditary deficiency of other clotting factors: Secondary | ICD-10-CM

## 2013-10-08 DIAGNOSIS — R079 Chest pain, unspecified: Secondary | ICD-10-CM

## 2013-10-08 DIAGNOSIS — Z86711 Personal history of pulmonary embolism: Secondary | ICD-10-CM

## 2013-10-08 MED ORDER — RANITIDINE HCL 150 MG PO TABS: 150.0000 mg | ORAL_TABLET | Freq: Two times a day (BID) | ORAL | Status: AC

## 2013-10-08 NOTE — Progress Notes (Signed)
Reviewed [facility] hospital report of chest pain admission 10/04/13. EKG negative and enzymes negative x 3. Recommended outpatient stress test.  Will order. Also the patient did get some relief with Gi cocktail and will add zantac as well.

## 2013-10-08 NOTE — Progress Notes (Signed)
Spoke with Pt, notified her we called in Zantac.  Scheduled her for Stress Test 10/15/13 at 12:30 [facility]

## 2013-10-14 ENCOUNTER — Encounter: Payer: Self-pay | Admitting: Family Medicine

## 2013-11-05 ENCOUNTER — Encounter: Payer: Self-pay | Admitting: Family Medicine

## 2013-11-20 ENCOUNTER — Encounter: Payer: Self-pay | Admitting: Family Medicine

## 2013-11-21 ENCOUNTER — Other Ambulatory Visit: Payer: Self-pay | Admitting: *Deleted

## 2013-11-21 MED ORDER — LEVOTHYROXINE SODIUM 50 MCG PO TABS: 50.0000 ug | ORAL_TABLET | Freq: Every day | ORAL | Status: DC

## 2013-11-25 ENCOUNTER — Encounter: Payer: Self-pay | Admitting: Family Medicine

## 2013-11-29 ENCOUNTER — Ambulatory Visit (INDEPENDENT_AMBULATORY_CARE_PROVIDER_SITE_OTHER): Payer: BC Managed Care – PPO | Admitting: Family Medicine

## 2013-11-29 ENCOUNTER — Encounter: Payer: Self-pay | Admitting: Family Medicine

## 2013-11-29 VITALS — BP 128/60 | HR 110 | Temp 98.2°F | Resp 20 | Ht 62.0 in | Wt 135.2 lb

## 2013-11-29 DIAGNOSIS — F4310 Post-traumatic stress disorder, unspecified: Secondary | ICD-10-CM

## 2013-11-29 DIAGNOSIS — D6859 Other primary thrombophilia: Secondary | ICD-10-CM

## 2013-11-29 DIAGNOSIS — E039 Hypothyroidism, unspecified: Secondary | ICD-10-CM

## 2013-11-29 DIAGNOSIS — R42 Dizziness and giddiness: Secondary | ICD-10-CM

## 2013-11-29 DIAGNOSIS — Z8669 Personal history of other diseases of the nervous system and sense organs: Secondary | ICD-10-CM

## 2013-11-29 DIAGNOSIS — F411 Generalized anxiety disorder: Secondary | ICD-10-CM | POA: Insufficient documentation

## 2013-11-29 DIAGNOSIS — Z5181 Encounter for therapeutic drug level monitoring: Secondary | ICD-10-CM

## 2013-11-29 DIAGNOSIS — Z0000 Encounter for general adult medical examination without abnormal findings: Secondary | ICD-10-CM

## 2013-11-29 DIAGNOSIS — D6861 Antiphospholipid syndrome: Secondary | ICD-10-CM

## 2013-11-29 MED ORDER — MECLIZINE HCL 12.5 MG PO TABS: 12.5000 mg | ORAL_TABLET | Freq: Three times a day (TID) | ORAL | Status: AC | PRN

## 2013-11-29 MED ORDER — ALPRAZOLAM 0.5 MG PO TABS: ORAL_TABLET | ORAL | Status: DC

## 2013-11-29 NOTE — Patient Instructions (Signed)
Meclizine tablets or capsules What is this medicine? MECLIZINE (MEK li zeen) is an antihistamine. It is used to prevent nausea, vomiting, or dizziness caused by motion sickness. It is also used to prevent and treat vertigo (extreme dizziness or a feeling that you or your surroundings are tilting or spinning around). This medicine may be used for other purposes; ask your health care provider or pharmacist if you have questions. COMMON BRAND NAME(S): Antivert, Dramamine Less Drowsy, Medivert, Meni-D  What should I tell my health care provider before I take this medicine? They need to know if you have any of these conditions: -asthma -glaucoma -prostate trouble -stomach problems -urinary problems -an unusual or allergic reaction to meclizine, other medicines, foods, dyes, or preservatives -pregnant or trying to get pregnant -breast-feeding How should I use this medicine? Take this medicine by mouth with a glass of water. Follow the directions on the prescription label. If you are using this medicine to prevent motion sickness, take the dose at least 1 hour before travel. If it upsets your stomach, take it with food or milk. Take your doses at regular intervals. Do not take your medicine more often than directed. Talk to your pediatrician regarding the use of this medicine in children. Special care may be needed. Overdosage: If you think you have taken too much of this medicine contact a poison control center or emergency room at once. NOTE: This medicine is only for you. Do not share this medicine with others. What if I miss a dose? If you miss a dose, take it as soon as you can. If it is almost time for your next dose, take only that dose. Do not take double or extra doses. What may interact with this medicine? -barbiturate medicines for inducing sleep or treating seizures -digoxin -medicines for anxiety or sleeping problems, like alprazolam, diazepam or temazepam -medicines for hay fever and  other allergies -medicines for mental depression -medicines for movement abnormalities as in Parkinson's disease, or for stomach problems -medicines for pain -medicines that relax muscles This list may not describe all possible interactions. Give your health care provider a list of all the medicines, herbs, non-prescription drugs, or dietary supplements you use. Also tell them if you smoke, drink alcohol, or use illegal drugs. Some items may interact with your medicine. What should I watch for while using this medicine? If you are taking this medicine on a regular schedule, visit your doctor or health care professional for regular checks on your progress. You may get dizzy, drowsy or have blurred vision. Do not drive, use machinery, or do anything that needs mental alertness until you know how this medicine affects you. Do not stand or sit up quickly, especially if you are an older patient. This reduces the risk of dizzy or fainting spells. Alcohol can increase possible dizziness. Avoid alcoholic drinks. Your mouth may get dry. Chewing sugarless gum or sucking hard candy, and drinking plenty of water may help. Contact your doctor if the problem does not go away or is severe. This medicine may cause dry eyes and blurred vision. If you wear contact lenses you may feel some discomfort. Lubricating drops may help. See your eye doctor if the problem does not go away or is severe. What side effects may I notice from receiving this medicine? Side effects that you should report to your doctor or health care professional as soon as possible: -fainting spells -fast or irregular heartbeat Side effects that usually do not require medical attention (report to  your doctor or health care professional if they continue or are bothersome): -constipation -difficulty passing urine -difficulty sleeping -headache -stomach upset This list may not describe all possible side effects. Call your doctor for medical advice  about side effects. You may report side effects to FDA at 1-800-FDA-1088. Where should I keep my medicine? Keep out of the reach of children. Store at room temperature between 15 and 30 degrees C (59 and 86 degrees F). Keep container tightly closed. Throw away any unused medicine after the expiration date. NOTE: This sheet is a summary. It may not cover all possible information. If you have questions about this medicine, talk to your doctor, pharmacist, or health care provider.  2014, [employer]vier/Gold Standard. (2008-05-01 10:35:36) Vertigo Vertigo means you feel like you or your surroundings are moving when they are not. Vertigo can be dangerous if it occurs when you are at work, driving, or performing difficult activities.  CAUSES  Vertigo occurs when there is a conflict of signals sent to your brain from the visual and sensory systems in your body. There are many different causes of vertigo, including:  Infections, especially in the inner ear.  A bad reaction to a drug or misuse of alcohol and medicines.  Withdrawal from drugs or alcohol.  Rapidly changing positions, such as lying down or rolling over in bed.  A migraine headache.  Decreased blood flow to the brain.  Increased pressure in the brain from a head injury, infection, tumor, or bleeding. SYMPTOMS  You may feel as though the world is spinning around or you are falling to the ground. Because your balance is upset, vertigo can cause nausea and vomiting. You may have involuntary eye movements (nystagmus). DIAGNOSIS  Vertigo is usually diagnosed by physical exam. If the cause of your vertigo is unknown, your caregiver may perform imaging tests, such as an MRI scan (magnetic resonance imaging). TREATMENT  Most cases of vertigo resolve on their own, without treatment. Depending on the cause, your caregiver may prescribe certain medicines. If your vertigo is related to body position issues, your caregiver may recommend movements or  procedures to correct the problem. In rare cases, if your vertigo is caused by certain inner ear problems, you may need surgery. HOME CARE INSTRUCTIONS   Follow your caregiver's instructions.  Avoid driving.  Avoid operating heavy machinery.  Avoid performing any tasks that would be dangerous to you or others during a vertigo episode.  Tell your caregiver if you notice that certain medicines seem to be causing your vertigo. Some of the medicines used to treat vertigo episodes can actually make them worse in some people. SEEK IMMEDIATE MEDICAL CARE IF:   Your medicines do not relieve your vertigo or are making it worse.  You develop problems with talking, walking, weakness, or using your arms, hands, or legs.  You develop severe headaches.  Your nausea or vomiting continues or gets worse.  You develop visual changes.  A family member notices behavioral changes.  Your condition gets worse. MAKE SURE YOU:  Understand these instructions.  Will watch your condition.  Will get help right away if you are not doing well or get worse. Document Released: 08/03/2005 Document Revised: 01/16/2012 Document Reviewed: 05/12/2011 [facility] Patient Information 2014 [place], [place]. Dizziness Dizziness is a common problem. It is a feeling of unsteadiness or lightheadedness. You may feel like you are about to faint. Dizziness can lead to injury if you stumble or fall. A person of any age group can suffer from dizziness,  but dizziness is more common in older adults. CAUSES  Dizziness can be caused by many different things, including:  Middle ear problems.  Standing for too long.  Infections.  An allergic reaction.  Aging.  An emotional response to something, such as the sight of blood.  Side effects of medicines.  Fatigue.  Problems with circulation or blood pressure.  Excess use of alcohol, medicines, or illegal drug use.  Breathing too fast (hyperventilation).  An  arrhythmia or problems with your heart rhythm.  Low red blood cell count (anemia).  Pregnancy.  Vomiting, diarrhea, fever, or other illnesses that cause dehydration.  Diseases or conditions such as Parkinson's disease, high blood pressure (hypertension), diabetes, and thyroid problems.  Exposure to extreme heat. DIAGNOSIS  To find the cause of your dizziness, your caregiver may do a physical exam, lab tests, radiologic imaging scans, or an electrocardiography test (ECG).  TREATMENT  Treatment of dizziness depends on the cause of your symptoms and can vary greatly. HOME CARE INSTRUCTIONS   Drink enough fluids to keep your urine clear or pale yellow. This is especially important in very hot weather. In the elderly, it is also important in cold weather.  If your dizziness is caused by medicines, take them exactly as directed. When taking blood pressure medicines, it is especially important to get up slowly.  Rise slowly from chairs and steady yourself until you feel okay.  In the morning, first sit up on the side of the bed. When this seems okay, stand slowly while holding onto something until you know your balance is fine.  If you need to stand in one place for a long time, be sure to move your legs often. Tighten and relax the muscles in your legs while standing.  If dizziness continues to be a problem, have someone stay with you for a day or two. Do this until you feel you are well enough to stay alone. Have the person call your caregiver if he or she notices changes in you that are concerning.  Do not drive or use heavy machinery if you feel dizzy.  Do not drink alcohol. SEEK IMMEDIATE MEDICAL CARE IF:   Your dizziness or lightheadedness gets worse.  You feel nauseous or vomit.  You develop problems with talking, walking, weakness, or using your arms, hands, or legs.  You are not thinking clearly or you have difficulty forming sentences. It may take a friend or family member  to determine if your thinking is normal.  You develop chest pain, abdominal pain, shortness of breath, or sweating.  Your vision changes.  You notice any bleeding.  You have side effects from medicine that seems to be getting worse rather than better. MAKE SURE YOU:   Understand these instructions.  Will watch your condition.  Will get help right away if you are not doing well or get worse. Document Released: 04/19/2001 Document Revised: 01/16/2012 Document Reviewed: 05/13/2011 [facility] Patient Information 2014 [place], [place].

## 2013-11-29 NOTE — Progress Notes (Signed)
Subjective:     Emily Roberson is a 63 y.o. female who presents for annual exam.  She particularly has complaints of dizziness. The symptoms started 4 days ago and are unchanged. She also has a wristling sound and last a few minutes. The attacks occur intermittently and last 5 minutes. Positions that worsen symptoms: any motion. Previous workup/treatments: blood work: negative and MRI of the brain. The MRI was reviewed and was negative but patient says she was told there were some spots on the MRI. Associated ear symptoms: tinnitus. Associated CNS symptoms: none. Recent infections: none. Head trauma: denied. Drug ingestion: sedatives and patient is on xanax prn for anxiety. Noise exposure: no occupational exposure. Family history: non-contributory.  The following portions of the patient's history were reviewed and updated as appropriate:  She  has a past medical history of Depression; Thyroid disease; PE (pulmonary embolism); Environmental allergies; Allergy; Anxiety; GERD (gastroesophageal reflux disease); Glaucoma; Hyperlipidemia; Stroke; Factor V deficiency; and B12 deficiency. She  does not have any pertinent problems on file. She  has past surgical history that includes Tonsillectomy; Bunionectomy; Bone marrow biopsy; and Intraocular lens insertion (Bilateral). Her family history includes Cancer in her father and maternal grandmother; Heart disease in her mother. She  reports that she has been smoking.  She does not have any smokeless tobacco history on file. She reports that she does not drink alcohol or use illicit drugs. Current Outpatient Prescriptions on File Prior to Visit  Medication Sig Dispense Refill  . b complex vitamins tablet Take 1 tablet by mouth daily.      . benzonatate (TESSALON PERLES) 100 MG capsule Take 1 capsule (100 mg total) by mouth 3 (three) times daily as needed for cough.  20 capsule  0  . escitalopram (LEXAPRO) 20 MG tablet Take 20 mg by mouth daily.      Marland Kitchen  latanoprost (XALATAN) 0.005 % ophthalmic solution Place 1 drop into the right eye at bedtime.      Marland Kitchen levothyroxine (SYNTHROID, LEVOTHROID) 50 MCG tablet Take 1 tablet (50 mcg total) by mouth daily.  90 tablet  0  . montelukast (SINGULAIR) 10 MG tablet Take 10 mg by mouth at bedtime.      . ranitidine (ZANTAC) 150 MG tablet Take 1 tablet (150 mg total) by mouth 2 (two) times daily.  60 tablet  1  . Rivaroxaban (XARELTO) 15 MG TABS tablet Take 15 mg by mouth daily with supper.       No current facility-administered medications on file prior to visit.   She is allergic to ceftin; contrast media; keflex; peanuts; shellfish allergy; and statins..  Review of Systems Pertinent items are noted in HPI.    Objective:    BP 128/60  Pulse 110  Temp(Src) 98.2 F (36.8 C) (Temporal)  Resp 20  Ht 5' 2" (1.575 m)  Wt 135 lb 4 oz (61.349 kg)  BMI 24.73 kg/m2  SpO2 97% General appearance: alert, cooperative, appears stated age, no distress and resting essential tremor Head: Normocephalic, without obvious abnormality, atraumatic, sinuses nontender to percussion Eyes: conjunctivae/corneas clear. PERRL, EOM's intact. Fundi benign. Ears: normal TM's and external ear canals both ears and negative Dix Hallpike Nose: Nares normal. Septum midline. Mucosa normal. No drainage or sinus tenderness. Throat: lips, mucosa, and tongue normal; teeth and gums normal Neck: no JVD, supple, symmetrical, trachea midline and thyroid not enlarged, symmetric, no tenderness/mass/nodules Lungs: clear to auscultation bilaterally and normal percussion bilaterally Heart: tachycardia Abdomen: soft, non-tender; bowel sounds normal; no  masses,  no organomegaly Extremities: extremities normal, atraumatic, no cyanosis or edema Pulses: 2+ and symmetric Skin: Skin color, texture, turgor normal. No rashes or lesions Lymph nodes: Cervical, supraclavicular, and axillary nodes normal. Neurologic: Alert and oriented X 3, normal strength  and tone. Normal symmetric reflexes. Normal coordination and gait      Assessment:    Dizziness, suspect Meniere's Disease    Emily Roberson was seen today for annual exam and dizziness.  Diagnoses and associated orders for this visit:  Visit for preventive health examination  Anxiety state, unspecified - ALPRAZolam (XANAX) 0.5 MG tablet; Take one tab by mouth at bedtime as needed for anxiety  PTSD (post-traumatic stress disorder) - ALPRAZolam (XANAX) 0.5 MG tablet; Take one tab by mouth at bedtime as needed for anxiety  Dizziness and giddiness - Sedimentation Rate; Future - C-reactive protein; Future - meclizine (ANTIVERT) 12.5 MG tablet; Take 1 tablet (12.5 mg total) by mouth 3 (three) times daily as needed for dizziness. - Sedimentation Rate - C-reactive protein  H/O tinnitus - Sedimentation Rate; Future - C-reactive protein; Future - meclizine (ANTIVERT) 12.5 MG tablet; Take 1 tablet (12.5 mg total) by mouth 3 (three) times daily as needed for dizziness. - Sedimentation Rate - C-reactive protein  Unspecified hypothyroidism - TSH; Future - T4, free; Future - Basic metabolic panel; Future - CBC with Differential; Future - TSH - T4, free - Basic metabolic panel - CBC with Differential  Anticardiolipin syndrome - TSH; Future - T4, free; Future - Basic metabolic panel; Future - CBC with Differential; Future - Lipid Panel; Future - TSH - T4, free - Basic metabolic panel - CBC with Differential - Lipid Panel  Medication monitoring encounter - CBC with Differential; Future - CBC with Differential Plan:    Meclizine per medication orders. Decongestants per medication orders. The nature of vertigo syndromes were discussed along with their usual course and treatment. Educational materials given and questions answered. will get labs to include ESR/CRP. WIth hx of vertigo and tinnitis and patient was on meclizine at one point with relief of symptoms, will add this  medicine back to her treatment regimen.  To follow up pending lab results. Xanax reordered and to see psychiatry as scheduled for further treatment options of psychiatric symptoms i.e anxiety for the best treatment regimen.

## 2013-12-02 ENCOUNTER — Other Ambulatory Visit: Payer: Self-pay | Admitting: Family Medicine

## 2013-12-02 ENCOUNTER — Encounter: Payer: Self-pay | Admitting: Family Medicine

## 2013-12-02 DIAGNOSIS — Z5181 Encounter for therapeutic drug level monitoring: Secondary | ICD-10-CM | POA: Insufficient documentation

## 2013-12-02 DIAGNOSIS — D6861 Antiphospholipid syndrome: Secondary | ICD-10-CM | POA: Insufficient documentation

## 2013-12-02 DIAGNOSIS — Z0000 Encounter for general adult medical examination without abnormal findings: Secondary | ICD-10-CM | POA: Insufficient documentation

## 2013-12-02 LAB — LIPID PANEL
CHOLESTEROL: 230 mg/dL — AB (ref 0–200)
HDL: 40 mg/dL (ref >39)
LDL CALC: 152 mg/dL — AB (ref 0–99)
TRIGLYCERIDES: 191 mg/dL — AB (ref <150)
Total CHOL/HDL Ratio: 5.8 Ratio
VLDL: 38 mg/dL (ref 0–40)

## 2013-12-02 LAB — CBC WITH DIFFERENTIAL/PLATELET
BASOS ABS: 0 10*3/uL (ref 0.0–0.1)
BASOS PCT: 0 % (ref 0–1)
Eosinophils Absolute: 0.1 10*3/uL (ref 0.0–0.7)
Eosinophils Relative: 1 % (ref 0–5)
HEMATOCRIT: 43.2 % (ref 36.0–46.0)
Hemoglobin: 15.1 g/dL — ABNORMAL HIGH (ref 12.0–15.0)
LYMPHS PCT: 23 % (ref 12–46)
Lymphs Abs: 1.5 10*3/uL (ref 0.7–4.0)
MCH: 33.4 pg (ref 26.0–34.0)
MCHC: 35 g/dL (ref 30.0–36.0)
MCV: 95.6 fL (ref 78.0–100.0)
MONO ABS: 0.5 10*3/uL (ref 0.1–1.0)
Monocytes Relative: 7 % (ref 3–12)
NEUTROS ABS: 4.6 10*3/uL (ref 1.7–7.7)
NEUTROS PCT: 69 % (ref 43–77)
PLATELETS: 345 10*3/uL (ref 150–400)
RBC: 4.52 MIL/uL (ref 3.87–5.11)
RDW: 13.9 % (ref 11.5–15.5)
WBC: 6.7 10*3/uL (ref 4.0–10.5)

## 2013-12-02 LAB — TSH: TSH: 2.242 u[IU]/mL (ref 0.350–4.500)

## 2013-12-02 LAB — BASIC METABOLIC PANEL
BUN: 12 mg/dL (ref 6–23)
CO2: 31 meq/L (ref 19–32)
CREATININE: 0.78 mg/dL (ref 0.50–1.10)
Calcium: 9.8 mg/dL (ref 8.4–10.5)
Chloride: 102 mEq/L (ref 96–112)
GLUCOSE: 96 mg/dL (ref 70–99)
Potassium: 4.9 mEq/L (ref 3.5–5.3)
SODIUM: 141 meq/L (ref 135–145)

## 2013-12-02 LAB — T4, FREE: Free T4: 1.04 ng/dL (ref 0.80–1.80)

## 2013-12-02 LAB — C-REACTIVE PROTEIN: CRP: <0.5 mg/dL (ref <0.60)

## 2013-12-03 ENCOUNTER — Encounter: Payer: Self-pay | Admitting: Family Medicine

## 2013-12-03 ENCOUNTER — Telehealth: Payer: Self-pay | Admitting: Family Medicine

## 2013-12-03 ENCOUNTER — Other Ambulatory Visit: Payer: Self-pay | Admitting: Family Medicine

## 2013-12-03 DIAGNOSIS — Z91041 Radiographic dye allergy status: Secondary | ICD-10-CM

## 2013-12-03 DIAGNOSIS — J309 Allergic rhinitis, unspecified: Secondary | ICD-10-CM

## 2013-12-03 DIAGNOSIS — I6523 Occlusion and stenosis of bilateral carotid arteries: Secondary | ICD-10-CM

## 2013-12-03 DIAGNOSIS — D6861 Antiphospholipid syndrome: Secondary | ICD-10-CM

## 2013-12-03 DIAGNOSIS — E785 Hyperlipidemia, unspecified: Secondary | ICD-10-CM

## 2013-12-03 DIAGNOSIS — Z8669 Personal history of other diseases of the nervous system and sense organs: Secondary | ICD-10-CM

## 2013-12-03 DIAGNOSIS — Z516 Encounter for desensitization to allergens: Secondary | ICD-10-CM

## 2013-12-03 DIAGNOSIS — D682 Hereditary deficiency of other clotting factors: Secondary | ICD-10-CM

## 2013-12-03 DIAGNOSIS — R42 Dizziness and giddiness: Secondary | ICD-10-CM

## 2013-12-03 HISTORY — DX: Occlusion and stenosis of bilateral carotid arteries: I65.23

## 2013-12-03 LAB — C-REACTIVE PROTEIN: CRP: <0.5 mg/dL (ref <0.60)

## 2013-12-03 LAB — SEDIMENTATION RATE: Sed Rate: 5 mm/hr (ref 0–22)

## 2013-12-03 MED ORDER — MONTELUKAST SODIUM 10 MG PO TABS: 10.0000 mg | ORAL_TABLET | Freq: Every day | ORAL | Status: AC

## 2013-12-03 MED ORDER — FENOFIBRATE 145 MG PO TABS: 145.0000 mg | ORAL_TABLET | Freq: Every day | ORAL | Status: DC

## 2013-12-03 MED ORDER — DIPHENHYDRAMINE HCL 50 MG PO TABS: ORAL_TABLET | ORAL | Status: AC

## 2013-12-03 MED ORDER — PREDNISONE 50 MG PO TABS
ORAL_TABLET | ORAL | Status: AC
Start: 2013-12-03 — End: ?

## 2013-12-03 MED ORDER — FENOFIBRATE 120 MG PO TABS: 120.0000 mg | ORAL_TABLET | Freq: Every day | ORAL | Status: DC

## 2013-12-03 NOTE — Telephone Encounter (Signed)
Have spoken to husband about labs and will send labs at home as well for their review. As discussed during the office visit, will get BMP first to assure normal renal function before doing MRA head and neck. 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  She has worsening dizziness, leaning toward the left side, tinnitis of the left ear>right ear, and some numbness to left side of body in the last 3-4 days; that comes and goes. She has been on xarelto for years. Scan needed to evaluate for vertebral artery syndrome, TIA, or steal syndrome.   Will need to pretreat with Prednisone 50 mg at 13 hours, 7 hours, and 1 hour prior to study. Also with Benadryl 50 mg x 1 dose at 1 hour prior to study. Patient given written instructions in lab letter.

## 2013-12-04 ENCOUNTER — Encounter: Payer: Self-pay | Admitting: Family Medicine

## 2013-12-09 ENCOUNTER — Encounter (HOSPITAL_COMMUNITY): Payer: Self-pay

## 2013-12-09 ENCOUNTER — Ambulatory Visit (HOSPITAL_COMMUNITY)
Admission: RE | Admit: 2013-12-09 | Discharge: 2013-12-09 | Disposition: A | Discharge status: Home / Self Care | Payer: BC Managed Care – PPO | Source: Ambulatory Visit | Attending: Family Medicine | Admitting: Family Medicine

## 2013-12-09 DIAGNOSIS — I6523 Occlusion and stenosis of bilateral carotid arteries: Secondary | ICD-10-CM

## 2013-12-09 DIAGNOSIS — R42 Dizziness and giddiness: Secondary | ICD-10-CM

## 2013-12-09 DIAGNOSIS — Q283 Other malformations of cerebral vessels: Secondary | ICD-10-CM | POA: Insufficient documentation

## 2013-12-09 DIAGNOSIS — D682 Hereditary deficiency of other clotting factors: Secondary | ICD-10-CM

## 2013-12-09 DIAGNOSIS — Z8669 Personal history of other diseases of the nervous system and sense organs: Secondary | ICD-10-CM

## 2013-12-09 DIAGNOSIS — H9319 Tinnitus, unspecified ear: Secondary | ICD-10-CM | POA: Insufficient documentation

## 2013-12-09 DIAGNOSIS — R209 Unspecified disturbances of skin sensation: Secondary | ICD-10-CM | POA: Insufficient documentation

## 2013-12-09 DIAGNOSIS — E785 Hyperlipidemia, unspecified: Secondary | ICD-10-CM

## 2013-12-09 DIAGNOSIS — D6861 Antiphospholipid syndrome: Secondary | ICD-10-CM

## 2013-12-09 MED ORDER — GADOBENATE DIMEGLUMINE 529 MG/ML IV SOLN
15.0000 mL | Freq: Once | INTRAVENOUS | Status: AC | PRN
  Administered 2013-12-09: 15 mL via INTRAVENOUS

## 2013-12-10 ENCOUNTER — Encounter: Payer: Self-pay | Admitting: Family Medicine

## 2013-12-11 ENCOUNTER — Encounter: Payer: Self-pay | Admitting: Family Medicine

## 2013-12-23 ENCOUNTER — Encounter: Payer: Self-pay | Admitting: Family Medicine

## 2013-12-30 ENCOUNTER — Encounter: Payer: Self-pay | Admitting: Family Medicine

## 2014-01-02 ENCOUNTER — Encounter: Payer: Self-pay | Admitting: Family Medicine

## 2014-01-27 ENCOUNTER — Encounter: Payer: Self-pay | Admitting: Family Medicine

## 2014-01-29 ENCOUNTER — Encounter: Payer: Self-pay | Admitting: Family Medicine

## 2014-02-02 ENCOUNTER — Other Ambulatory Visit: Payer: Self-pay | Admitting: Family Medicine

## 2014-02-03 NOTE — Telephone Encounter (Signed)
Pt needs a refill on [facility]

## 2014-02-06 ENCOUNTER — Encounter: Payer: Self-pay | Admitting: Family Medicine

## 2014-02-06 ENCOUNTER — Ambulatory Visit (INDEPENDENT_AMBULATORY_CARE_PROVIDER_SITE_OTHER): Payer: BC Managed Care – PPO | Admitting: Family Medicine

## 2014-02-06 VITALS — BP 100/60 | HR 72 | Temp 97.8°F | Resp 18 | Ht 63.5 in | Wt 132.4 lb

## 2014-02-06 DIAGNOSIS — F4310 Post-traumatic stress disorder, unspecified: Secondary | ICD-10-CM

## 2014-02-06 DIAGNOSIS — F411 Generalized anxiety disorder: Secondary | ICD-10-CM

## 2014-02-06 DIAGNOSIS — E039 Hypothyroidism, unspecified: Secondary | ICD-10-CM

## 2014-02-06 DIAGNOSIS — E785 Hyperlipidemia, unspecified: Secondary | ICD-10-CM

## 2014-02-06 LAB — LIPID PANEL
Cholesterol: 177 mg/dL (ref 0–200)
HDL: 47 mg/dL (ref >39)
LDL Cholesterol: 112 mg/dL — ABNORMAL HIGH (ref 0–99)
TRIGLYCERIDES: 92 mg/dL (ref <150)
Total CHOL/HDL Ratio: 3.8 Ratio
VLDL: 18 mg/dL (ref 0–40)

## 2014-02-06 LAB — T4, FREE: FREE T4: 1.14 ng/dL (ref 0.80–1.80)

## 2014-02-06 LAB — TSH: TSH: 2.216 u[IU]/mL (ref 0.350–4.500)

## 2014-02-06 MED ORDER — FENOFIBRATE 120 MG PO TABS: 120.0000 mg | ORAL_TABLET | Freq: Every day | ORAL | Status: DC

## 2014-02-06 MED ORDER — ESCITALOPRAM OXALATE 20 MG PO TABS: 20.0000 mg | ORAL_TABLET | Freq: Every day | ORAL | Status: AC

## 2014-02-06 MED ORDER — ALPRAZOLAM 0.5 MG PO TABS: ORAL_TABLET | ORAL | Status: AC

## 2014-02-06 NOTE — Progress Notes (Signed)
Subjective:     Patient ID: Emily Roberson, female   DOB: 09/29/1951, 63 y.o.   MRN: [id number]  Hyperlipidemia This is a chronic problem. The current episode started more than 1 year ago. The problem is controlled. Exacerbating diseases include hypothyroidism. She has no history of chronic renal disease. Factors aggravating her hyperlipidemia include smoking. Pertinent negatives include no focal sensory loss, focal weakness, myalgias or shortness of breath. Current antihyperlipidemic treatment includes fibric acid derivatives. There are no compliance problems.  Risk factors for coronary artery disease include stress and family history.  Anxiety Presents for follow-up visit. Symptoms include excessive worry, nervous/anxious behavior and restlessness. Patient reports no decreased concentration, dizziness or shortness of breath. Symptoms occur occasionally. The severity of symptoms is moderate. The symptoms are aggravated by family issues ([place]oved here from [place] and would like to get back). The quality of sleep is fair. Nighttime awakenings: occasional.   Her past medical history is significant for anxiety/panic attacks. Past treatments include benzodiazephines. The treatment provided significant relief. Compliance with prior treatments has been good.     Review of Systems  Respiratory: Negative for shortness of breath.   Musculoskeletal: Negative for myalgias.  Neurological: Negative for dizziness and focal weakness.  Psychiatric/Behavioral: Negative for decreased concentration. The patient is nervous/anxious.        Objective:   Physical Exam  Nursing note and vitals reviewed. Constitutional: She appears well-developed and well-nourished.  HENT:  Head: Normocephalic and atraumatic.  Right Ear: External ear normal.  Left Ear: External ear normal.  Cardiovascular: Normal rate, regular rhythm and normal heart sounds.   Pulmonary/Chest: Effort normal and breath sounds normal.  Skin: Skin is warm  and dry.  Psychiatric: She has a normal mood and affect. Her behavior is normal.       Assessment:     Emily Roberson was seen today for hyperlipidemia and medication refill.  Diagnoses and associated orders for this visit:  HLD (hyperlipidemia) - Lipid Panel - Fenofibrate 120 MG TABS; Take 1 tablet (120 mg total) by mouth daily.  Anxiety state, unspecified - ALPRAZolam (XANAX) 0.5 MG tablet; Take one tab by mouth daily and bedtime as needed for anxiety  Unspecified hypothyroidism - TSH - T4, free  PTSD (post-traumatic stress disorder) - ALPRAZolam (XANAX) 0.5 MG tablet; Take one tab by mouth daily and bedtime as needed for anxiety  Other Orders - escitalopram (LEXAPRO) 20 MG tablet; Take 1 tablet (20 mg total) by mouth daily.       Plan:     Refilled medicines and getting lipid panel and repeat TSH.

## 2014-02-07 ENCOUNTER — Encounter: Payer: Self-pay | Admitting: Family Medicine

## 2014-02-07 ENCOUNTER — Other Ambulatory Visit: Payer: Self-pay | Admitting: Family Medicine

## 2014-02-07 MED ORDER — LEVOTHYROXINE SODIUM 50 MCG PO TABS: 50.0000 ug | ORAL_TABLET | Freq: Every day | ORAL | Status: AC

## 2014-02-11 ENCOUNTER — Other Ambulatory Visit: Payer: Self-pay | Admitting: Family Medicine

## 2014-02-11 DIAGNOSIS — E785 Hyperlipidemia, unspecified: Secondary | ICD-10-CM

## 2014-02-11 MED ORDER — FENOFIBRATE 145 MG PO TABS: 145.0000 mg | ORAL_TABLET | Freq: Every day | ORAL | Status: DC

## 2014-02-11 MED ORDER — FENOFIBRATE 145 MG PO TABS: 145.0000 mg | ORAL_TABLET | Freq: Every day | ORAL | Status: AC

## 2017-05-29 ENCOUNTER — Encounter: Payer: Self-pay | Admitting: Gastroenterology

## 2017-07-19 ENCOUNTER — Encounter: Payer: Self-pay | Admitting: Gastroenterology

## 2017-08-08 ENCOUNTER — Ambulatory Visit: Payer: Medicare (Managed Care) | Attending: Medical Oncology | Admitting: Medical Oncology

## 2017-08-08 VITALS — BP 124/58 | HR 80 | Temp 97.5°F | Wt 136.3 lb

## 2017-08-08 DIAGNOSIS — D6851 Activated protein C resistance: Secondary | ICD-10-CM | POA: Insufficient documentation

## 2017-08-08 NOTE — Progress Notes (Signed)
Dear Dr. Darrol Angel,    I had the pleasure of seeing Ms. Jhane Lorio,  in consultation today at [facility] and [facility] in [place]. Please allow me to reiterate her history for our mutual records.    History of Present Illness   Ms. Wisener is a 66 y.o. female who had bilateral pulmonary embolism and CVA in 2009. She was initially treated with heparin and eventually switched to Coumadin.  She continues to take Coumadin since that time without any adverse event and according to the patient her PT has been therapeutic.  In the past she was seen by a neuro-ophthalmologist for facial pain and rising intraocular pressure.  Her workup also included anti-phospholipid and anti-serine antibodies.  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  According to the patient she was also positive for factor V Leiden mutation but does not know if she was heterozygous or homozygous.  She was recently seen by her neuro-ophthalmologist and more workup was initiated for hypercoagulation.  She has been referred here for further evaluation.  Her mother died suddenly, positive for factor V Leiden.  Twin sister positive for factor V Leiden.  One daughter healthy and negative for factor V Leiden mutation.  One son 84 years old and has not been tested.  One cousin from maternal side also had factor V Leiden mutation.     On direct interview she admits of having a fair appetite and her weight is stable. Her energy level is fair and she denies any unusual fatigue. She denies any obvious bleeding and has not felt any abnormal lump. She has not been recently started on new medications, there has not been any recent infection, ER visit or hospitalization.     Review of Systems   Review of Systems   Constitutional: Negative for chills, fever, malaise/fatigue and weight loss.   HENT: Negative for ear discharge, ear pain, hearing loss, nosebleeds and tinnitus.    Eyes: Negative for blurred vision, double vision, pain and discharge.   Respiratory:  Negative for cough, hemoptysis, sputum production and shortness of breath.    Cardiovascular: Negative for chest pain and palpitations.   Gastrointestinal: Negative for abdominal pain, constipation, diarrhea, heartburn, melena, nausea and vomiting.   Genitourinary: Negative for dysuria, frequency and hematuria.   Musculoskeletal: Negative for back pain, joint pain, myalgias and neck pain.   Skin: Negative for itching and rash.   Neurological: Negative for dizziness, tingling, weakness and headaches.   Endo/Heme/Allergies: Does not bruise/bleed easily.   Psychiatric/Behavioral: Negative for depression and hallucinations.   All other systems reviewed and are negative.    Pertinent Past History   MEDICAL HISTORY  Past Medical History:   Diagnosis Date    Clotting disorder     Unspecified cerebral artery occlusion with cerebral infarction      SURGICAL HISTORY  Past Surgical History:   Procedure Laterality Date    BUNIONECTOMY  2004    Right foot    TONSILLECTOMY AND ADENOIDECTOMY  1972     SOCIAL HISTORY  Social History     Social History    Marital status: Married     Spouse name: N/A    Number of children: N/A    Years of education: N/A     Occupational History    Not on file.     Social History Main Topics    Smoking status: Not on file    Smokeless tobacco: Not on file    Alcohol use Not on file  Drug use: Not on file    Sexual activity: Not on file     Social History Narrative    No narrative on file       FAMILY HISTORY  Family History   Problem Relation Age of Onset    Clotting disorder Mother     Cancer Father     Hypertension Father     Diabetes Father     Cancer Maternal Grandmother        Vitals Signs   BP 124/58   Pulse 80   Temp 36.4 C (97.5 F) (Temporal)    Wt 61.8 kg (136 lb 4.8 oz)   SpO2 97%   BMI 22.99 kg/m2    Problem List     Patient Active Problem List   Diagnosis Code    Memory Lapses Or Loss R41.3       Current Medications     Prior to Admission medications    Medication  Sig Start Date End Date Taking? Authorizing Provider   ALERT CONVERSION COMPLETE  Provider must reconcile medications 02/15/11   Provider, Conversion   alprazolam (XANAX) 0.5 MG tablet Take 0.5 mg by mouth 2 times daily.    [provider]   escitalopram (LEXAPRO) 20 MG tablet Take 20 mg by mouth daily.    [provider]   montelukast (SINGULAIR) 10 MG tablet Take 10 mg by mouth nightly.    [provider]   warfarin (COUMADIN) 5 MG tablet Take 5 mg by mouth daily. 7.5mg  M-F,  Sat and Sun     [provider]   Cyanocobalamin (VITAMIN B 12 PO) Take  by mouth.    [provider]   carbamazepine (TEGRETOL) 200 MG tablet Take 100 mg by mouth daily.    [provider]   valacyclovir (VALTREX) 500 MG tablet Take 500 mg by mouth daily.    [provider]   aspirin 81 MG tablet  01/25/08   Provider, Conversion   Omega-3 Fatty Acids (FISH OIL) 1360 MG CAPS  01/25/08   Provider, Conversion   Flaxseed, Linseed, (FLAXSEED OIL) 1200 MG CAPS  01/25/08   Provider, Conversion   ezetimibe (ZETIA) 10 MG tablet  01/25/08   Provider, Conversion   ALPRAZolam Prudy Feeler) 2 MG tablet  01/25/08   Provider, Conversion   escitalopram (LEXAPRO) 5 MG tablet  01/25/08   Provider, Conversion   risedronate (ACTONEL) 150 MG tablet TAKE 1 TABLET DAILY once per month 08/28/07   Provider, Conversion       Physical Exam   Physical Exam   Constitutional: She is oriented to person, place, and time. She appears well-developed and well-nourished.   HENT:   Head: Normocephalic and atraumatic.   Eyes: Conjunctivae are normal. Pupils are equal, round, and reactive to light.   Neck: Normal range of motion. Neck supple. No tracheal deviation present. No thyromegaly present.   Cardiovascular: Normal rate, regular rhythm and normal heart sounds.  Exam reveals no friction rub.    No murmur heard.  Pulmonary/Chest: Effort normal and breath sounds normal. No stridor. No respiratory distress. She has no  wheezes. She has no rales.   Abdominal: She exhibits no distension and no mass. There is no tenderness.   Musculoskeletal: Normal range of motion.   Neurological: She is alert and oriented to person, place, and time. No cranial nerve deficit.   Skin: Skin is warm and dry.   Psychiatric: She has a normal mood and affect. Her behavior  is normal. Judgment and thought content normal.      Lab Results    reviewed    Pathology Results       Imaging Results       Assessment   Ms. Bothwell a 66 y.o. female with a past history of bilateral pulmonary embolism and CVA in 2009 and she continues to take Coumadin since that time without any adverse event.  She has a very strong family history of factor V Leiden mutation and it is unclear at this point if she is heterozygous or homozygous.  Her previous workup by her  neuropathologist had also revealed anti-phospholipid antibody.  We discussed only a small percentage of individuals with FVL with heterozygosity develop DVT in the lifetime and approximate risk is 4-5% but it can go up to 20% in thrombophilic families.  On the other hand risk is far greater in the setting of homozygosity or compound heterozygosity for FVL.  It can sometimes also involve cerebral, portal and hepatic veins.      Decision regarding indefinite anticoagulation is also individualized.  Considering her strong family history of FVL (at this point unclear if homozygous or heterozygous), her past medical history of bilateral pulmonary embolism and CVA.  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      Clinically she is asymptomatic and has a good performance status.  Her physical examination did not reveal any obvious organomegaly or adenopathy. Final decision can be made once I obtain all her records and lab reports from her neuro-ophthalmologist. According to the patient she recently had more workup for hypercoagulation and  results are not available yet. If required will initiate further workup for hypercoagulation.     Counseling and education was provided. In total, I spent approximately 50 min. in consultations with the patient.  The majority of this time was spent in education, counseling and coordination of care.  During my encounter, I reviewed her history and overall evaluation also included laboratory data, in detail.  I specifically discussed hypercoagulation syndrome.  Ms. Rufo had several questions and all were addressed fully and seemingly to her satisfaction.    She will return to my clinic in 3 weeks to go over the results and further management plan unless there is any new hematology or oncology related problem.    Thank you once again for allowing me to participate in the care of Ms. Bruna. Should you have any questions, please feel free to contact me at any time.     Respectfully yours,      Author: Ok Anis, MD as of 08/08/2017 at 3:10 PM  Jaye Beagle, MD   Associate Professor, Hematology/Oncology  Division of Medicine

## 2017-08-22 NOTE — Progress Notes (Incomplete)
Reason for evaluation: Follow up for Factor V Leiden.    Performance Status: ECOG ***    Diagnosis and Treatment History:  Vicki Hancock a 66 y.o. female with a past history of bilateral pulmonary embolism and CVA in 2009 and she continues to take Coumadin since that time without any adverse event.  She has a very strong family history of factor V Leiden mutation and it is unclear at this point if she is heterozygous or homozygous.  Her previous workup by her  neuropathologist had also revealed anti-phospholipid antibody.     Current Treatment:  Coumadin    Interval History:       (pmh, psh, social history, current OP meds reviewed and up to date)    Review of Systems:  Remainder of 12 point ROS conducted and is negative. Pertinent positives as described above.    Vital Signs:  There were no vitals taken for this visit.    Wt Readings from Last 3 Encounters:   08/08/17 61.8 kg (136 lb 4.8 oz)   09/15/10 63 kg (138 lb 14.2 oz)   07/30/07 61.9 kg (136 lb 8 oz)     Physical Exam:  General: Sitting comfortably. No acute distress.  HEENT: Oral mucosa intact. No oral lesions. No thrush noted. Sclerae are anicteric. No jaundice. Pupils are equal and reactive to light.   Cardiovascular: S1 and S2 are audible. RRR without murmurs, rubs, or gallops appreciated.  Respiratory: Lungs are CTA bilaterally without wheezes, rhonchi, or rales.  Abdomen: Soft, non tender, non distended. Bowel sounds are normal.  Extremities: No peripheral edema. No calf tenderness on palpation.  Skin: No palmar/plantar erythema. No excessive drying.  Neurologic: alert and oriented. Normal affect.    Laboratory Review:  No results for input(s): WBC, HGB, HCT, RBC, PLT, ASEGR, ALYMR, AMONR, AEOSR, ABASR, SEGR, LYMPR, MONOR, EOSR, BASOR, BAND, MYELO, META, PROMY, BLAST in the last 8760 hours.    No results for input(s): NA, K, CL, CO2, UN, CREAT, GFRC, GFRB, GLU, CA, TP, ALB, ALT, AST, ALK, TB in the last 8760 hours.    Radiology Review:  No results  found.    Assessment/Plan:  Vicki Hancock a 66 y.o. year old female with pmhx of bilateral pulmonary embolism and CVA in 2009 and she continues to take Coumadin. 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      1.       Encouraged to call the office any time if problems or questions arise. She will return to the clinic in *** weeks for follow up evaluation and repeat blood work.  She had the opportunity to ask questions.  All her questions were answered to her satisfaction. She is agreeable with this plan.    Above evaluation and plan discussed with Dr. Marye Round.     Naida Sleight RPA-C on 08/22/17 at 2:23 PM  Hematology/Oncology at [facility]

## 2017-08-25 ENCOUNTER — Encounter: Payer: Self-pay | Admitting: Medical Oncology

## 2017-08-25 ENCOUNTER — Telehealth: Payer: Self-pay | Admitting: Medical Oncology

## 2017-08-29 ENCOUNTER — Ambulatory Visit: Payer: Medicare (Managed Care) | Admitting: Medical Oncology

## 2017-09-13 ENCOUNTER — Encounter: Payer: Self-pay | Admitting: Gastroenterology

## 2017-10-09 ENCOUNTER — Encounter: Payer: Self-pay | Admitting: Gastroenterology

## 2018-05-30 ENCOUNTER — Encounter: Payer: Self-pay | Admitting: Gastroenterology

## 2018-06-06 NOTE — Telephone Encounter (Signed)
error 

## 2018-08-08 ENCOUNTER — Encounter: Payer: Self-pay | Admitting: Gastroenterology

## 2018-09-07 HISTORY — PX: APPENDECTOMY: SHX54

## 2018-11-27 ENCOUNTER — Encounter: Payer: Self-pay | Admitting: Gastroenterology

## 2018-12-07 ENCOUNTER — Ambulatory Visit: Payer: Medicare (Managed Care) | Attending: Optometry | Admitting: Optometry

## 2018-12-07 ENCOUNTER — Encounter: Payer: Self-pay | Admitting: Optometry

## 2018-12-07 VITALS — Ht 63.0 in | Wt 135.0 lb

## 2018-12-07 DIAGNOSIS — Z961 Presence of intraocular lens: Secondary | ICD-10-CM

## 2018-12-07 DIAGNOSIS — M316 Other giant cell arteritis: Secondary | ICD-10-CM | POA: Insufficient documentation

## 2018-12-07 DIAGNOSIS — H43813 Vitreous degeneration, bilateral: Secondary | ICD-10-CM | POA: Insufficient documentation

## 2018-12-07 DIAGNOSIS — Q142 Congenital malformation of optic disc: Secondary | ICD-10-CM | POA: Insufficient documentation

## 2018-12-08 ENCOUNTER — Encounter: Payer: Self-pay | Admitting: Optometry

## 2018-12-10 NOTE — Progress Notes (Signed)
Outpatient Visit      Patient name: Vicki Hancock  DOB: Feb 02, 1951       Age: 68 y.o.  MR#: [id number]    Encounter Date: 12/07/2018    Subjective:      Chief Complaint   Patient presents with    Blurred Vision     HPI      Staley Budzinski is a 68 y.o. female presenting for a new patient eye   exam.   Patient states OS vision change. Pain on left hand side temoral on and   off. OS has been drooping down on the left hand side. No watering, or   discharge. Patient notes doctor recommended her to have an eye exam. Last   eye exam was in December. The symptoms have been happening 5-6 years ago.   Distance glasses are worn most of the time. Cataract surgery OU (OS   07/21/2005) (OD 08/18/2005)       Ocular Medications: AT'S PRN OU.   No history of surgery/trauma     Hx of being monitored for glaucoma with Dr. Kenton Kingfisher. She is sch to repeat   VF and OCT in 6 months  Feels like pain is radiating toward the jaw, associated eyelid drooping   with shooting pain  (-) jaw pain while eating  (-) temporal pain when brushing hair    Diplopia (-)  Weight Loss/Gain (-)  Fatigue (+)  Denies any pain when brushing hair  Denies any jaw pain when chewing although when having left sided episodes   of pain will feel the pain radiate to her jaw.   Started Pred ~ 3 weeks     Last edited by Kathryne Sharper, OD on 12/10/2018  8:47 AM. (History)        has a current medication list which includes the following prescription(s): prednisone, loratadine, atorvastatin, fluticasone, levothyroxine, xarelto, budesonide, escitalopram, montelukast, cyanocobalamin, lexapro, fenofibrate, clobetasol, alprazolam, warfarin, carbamazepine, valacyclovir, aspirin, fish oil, flaxseed oil, zetia, alprazolam, and actonel.     is allergic to contrast dye and acid blockers support.      Past Medical History:   Diagnosis Date    Clotting disorder     Unspecified cerebral artery occlusion with cerebral infarction       Past Surgical History:   Procedure Laterality  Date    APPENDECTOMY  09/2018    BUNIONECTOMY  2004    Right foot    TONSILLECTOMY AND ADENOIDECTOMY  1972        Specialty Problems     None           ROS     Positive for: Eyes    Negative for: Constitutional, Gastrointestinal, Neurological, Skin,   Genitourinary, Musculoskeletal, HENT, Endocrine, Cardiovascular,   Respiratory, Psychiatric, Allergic/Imm, Heme/Lymph    Last edited by Georjean Mode on 12/07/2018  2:05 PM. (History)         Objective:     Base Eye Exam     Visual Acuity (Snellen - Linear)       Right Left    Dist cc 20/25 -1/+2 20/25 -2    Near cc J1+ ou    Correction:  Glasses          Tonometry (Tonopen, 2:43 PM)       Right Left    Pressure 19 21          Pupils       Dark Light Shape React APD    Right 6  5 Round Brisk None    Left 6 5 ER Brisk None          Visual Fields (Counting fingers)       Left Right      Full    Restrictions Partial outer superior nasal deficiency           Extraocular Movement       Right Left     Full, Ortho Full, Ortho          Neuro/Psych     Oriented x3:  Yes    Mood/Affect:  Normal          Dilation     Both eyes:  2.5% Phenylephrine, 1.0% Tropicamide @ 3:25 PM            Additional Tests     Color       Right Left    Ishihara 7/7 7/7            Strabismus Exam     Observations:  Ortho    Distance Near Near +3DS N Bifocals                    Slit Lamp and Fundus Exam     Slit Lamp Exam       Right Left    Lens Posterior chamber intraocular lens, s/p YAG Posterior chamber intraocular lens, s/p YAG    Vitreous Posterior vitreous detachment, Weiss ring Posterior vitreous detachment, Weiss ring          Fundus Exam       Right Left    Disc Superior crescent Tilted, superior crescent    C/D Ratio 0.45 0.55    Periphery  nasally small retinal hole with pigmentation surrounding            Refraction     Wearing Rx       Sphere Cylinder Axis Add    Right -1.25 -1.75 082 +2.25    Left -1.25 -1.75 102 +2.25    Age:  2 months    Type:  PAL                        No  annotated images are attached to the encounter.      Assessment/Plan:      1. Giant cell arteritis  HVF Threshold-OU    Fundus Photos-Zeiss-OU    HVF Threshold-OU   2. Optic disc anomaly  Fundus Photos-Zeiss-OU   3. PVD (posterior vitreous detachment), both eyes     4. Pseudophakia, both eyes          HVF Threshold-OU           Right Eye  Findings include: Worsening. Reliability fair.     Left Eye  Findings include: Worsening. Reliability fair.     Notes  Images stored in Axis  (Reliability good unless otherwise indicated)    OD: Low test reliability. Scattered peripheral defects.  OS: Test reliability fair. Few scattered misses,  Unreliable since blind spot not present           Fundus Photos-Zeiss-OU           Right Eye  Findings include: Stable . Reliability good.     Left Eye  Findings include: Normal . Reliability good.     Notes  Images stored in Axis  (Reliability good unless otherwise indicated)    OD: Reliable. Small inferior crescent, deep cupping and slightly tilted  insertion.  OS: Reliable. Inferior crescent, tilted insertion, difficult to grade cupping.              PLAN:  1. No signs of GCA during today's examination. No optic nerve swelling or retinal heme. Per Charlett Nose she has been taking prednisolone for > 3 weeks but has still developed a left sided HA(less frequent). Per Charlett Nose she had been sent for a temporal artery biopsy but did not have it done because she had taken her Xarelto that morning and it was determined that she likely did not have GCA.    Charlesia has a history of these left sided headaches >9 years although now with increasing frequency, HA did not completely resolved with prednisolone. Hx of being Dx with herpetic neuralgia in 2011 as being the cause of intermittent stabbing left temple pain.     Per Dr. Kenton Kingfisher circulatory impairment in the setting of Factor V Leiden could also be the cause of left sided pain.     Elevated ESR 43 10/26/2018.  No additional lab work noted with 'care  everywhere' so it is unclear if ESR has decreased with prednisolone use. No CRP testing performed.   Recommend repeating ESP and checking CRP.    Depending upon lab work will determine future apts.

## 2019-01-07 ENCOUNTER — Encounter: Payer: Self-pay | Admitting: Gastroenterology

## 2019-02-06 NOTE — Progress Notes (Signed)
Vicki Hancock  Age: 68 y.o.  DOB: 12/04/1950  MRN: [id number]  [address]  [place] [address]  Preferred language: ENGLISH    Referring Provider: PCP Dr. Barry Dienes  Reason: Acquired coagulation factor deficiency  Assessment: 12/13/18 Ref doc, PCP OV notes, labs MEDIA    Labs:   CBC with Diff   No results for input(s): WBC, HGB, HCT, RBC, PLT, ASEGR, ALYMR, AMONR, AEOSR, ABASR, SEGR, LYMPR, MONOR, EOSR, BASOR, BAND, MYELO, META, PROMY, BLAST in the last 8760 hours.         CMP   No results for input(s): NA, K, CL, CO2, UN, CREAT, GFRC, GFRB, GLU, CA, TP, ALB, ALT, AST, ALK, TB in the last 8760 hours.     Iron Studies   No results for input(s): FE, TRANS, IBC, FESAT, FER, FOL, VB12 in the last 8760 hours.   Coags   No results for input(s): PTI, INR, PTT in the last 8760 hours.     Imaging:   CT Angio   CT Angio Chest: No results found.   CT Angio Head: No results found.   US Doppler vein   US Doppler Vein Lower Extremities: No results found.   US Doppler Vein Upper Extremities: No results found.         Past Medical History:   Diagnosis Date    Clotting disorder     Unspecified cerebral artery occlusion with cerebral infarction        Current Outpatient Medications on File Prior to Visit   Medication Sig Dispense Refill    predniSONE (DELTASONE) 10 MG tablet Take 10 mg by mouth daily      loratadine (CLARITIN) 10 MG tablet Take 10 mg by mouth daily      atorvastatin (LIPITOR) 10 MG tablet Take 10 mg by mouth daily      fenofibrate (TRICOR) 145 MG tablet   0    fluticasone (FLONASE) 50 MCG/ACT nasal spray 1 spray 2 times daily as needed  0    levothyroxine (SYNTHROID, LEVOTHROID) 50 MCG tablet   0    XARELTO 20 MG tablet   0    Budesonide (ENTOCORT EC) 3 MG 24 hr capsule   0    clobetasol (TEMOVATE) 0.05 % external solution apply to affected area ON SCALP twice a day if needed  0    alprazolam (XANAX) 0.5 MG tablet Take 0.5 mg by mouth 2 times daily.      escitalopram (LEXAPRO) 20 MG tablet Take 20 mg  by mouth daily.      montelukast (SINGULAIR) 10 MG tablet Take 10 mg by mouth nightly.      warfarin (COUMADIN) 5 MG tablet Take 5 mg by mouth daily. 7.'5mg'$  M-F, '5mg'$  Sat and Sun       Cyanocobalamin (VITAMIN B 12 PO) Take  by mouth.      carbamazepine (TEGRETOL) 200 MG tablet Take 100 mg by mouth daily.      valacyclovir (VALTREX) 500 MG tablet Take 500 mg by mouth daily.      aspirin 81 MG tablet  (Patient not taking: Reported on 12/07/2018)  0    Omega-3 Fatty Acids (FISH OIL) 1360 MG CAPS  (Patient not taking: Reported on 12/07/2018)  0    Flaxseed, Linseed, (FLAXSEED OIL) 1200 MG CAPS  (Patient not taking: Reported on 12/07/2018)  0    ezetimibe (ZETIA) 10 MG tablet  (Patient not taking: Reported on 12/07/2018)  0    ALPRAZolam Duanne Moron)  2 MG tablet  (Patient not taking: Reported on 12/07/2018)  0    escitalopram (LEXAPRO) 5 MG tablet   0    risedronate (ACTONEL) 150 MG tablet TAKE 1 TABLET DAILY once per month (Patient not taking: Reported on 12/07/2018) 90 3     No current facility-administered medications on file prior to visit.

## 2019-02-11 ENCOUNTER — Telehealth: Payer: Self-pay | Admitting: Ophthalmology

## 2019-02-11 NOTE — Telephone Encounter (Signed)
I spoke to patients husband and he will relay the message to [place].  She will call 4/7 to confirm Thursdays appointment or reschedule at a later date due to Covid-19 concerns.

## 2019-02-11 NOTE — Telephone Encounter (Signed)
Patient is calling to verify if this appotinment on 4/9 is still going to be held. Please call patient at [phone]

## 2019-02-12 NOTE — Telephone Encounter (Signed)
Confirmed appt for 02/14/19 , pre screened all nos 02/12/19

## 2019-02-14 ENCOUNTER — Encounter: Payer: Self-pay | Admitting: Ophthalmology

## 2019-02-14 ENCOUNTER — Ambulatory Visit: Payer: Medicare (Managed Care) | Attending: Ophthalmology | Admitting: Ophthalmology

## 2019-02-14 DIAGNOSIS — G501 Atypical facial pain: Secondary | ICD-10-CM | POA: Insufficient documentation

## 2019-02-14 NOTE — Progress Notes (Signed)
Subjective:   Subjective  02/14/2019   Chief Complaint   Patient presents with    New Patient Visit     Trigeminal Nerve Pain      HPI     New Patient Visit      Additional comments: Trigeminal Nerve Pain               Comments     Vicki CoverJudith Hancock is a 68 year old right handed woman presenting for an   evaluation of trigeminal nerve territory pain referred by Dr. Gevena Barreonaher.    The patient has at least 2 year history of sudden pain in the left temple   lasting no more than a minute that is associated with difficulty with   verbal expression.  This is followed by dull headache.  The patient has   had a few episodes on the right recently, but unassociated with speech   difficulty.  The interval can be days to 6 months, but average 20 episodes   annually.  The patient feels the temple is swollen to touch following an   episode. Patient rates the sharp pain as a 10/10 in severity and lasts   about 1-2 minutes at most. Once the sharp pain subsides she has a headache   rating as a 6/10 in severity that lasts the rest of the day. Patient has   started to notice that with the pain she has a ptosis of the lids and   "sagging" of the side of the face and it is hard for her to "hold up" the   muscles. With these episodes the patient has noticed fatigue that affect   her whole body. She has to rest before she can continue with activities.    The patient is currently on prednisone 5 mg per day for 6 months with   slight improvement.  The patient had HVF 24-2 on visit January 2020 which   were low reliability with scattered depression, but without specific   defect noted.       Patient denies any dysphagia. Patient endorses occasional episodes of   dyspnea and dysphonia that she attributes to her seasonal allergies.   Patient has a family history of heart attacks and blood clotting. Patient   was diagnosed with Leiden Factor V around 2010 after she had two pulmonary   embolisms in both lungs.     Patient reports that visual acuity has  been stable since last visit.   Patient notes a decrease in her peripheral vision at night while she is   driving, she has no issues during the day. Patient denies any noticeable   dimming of vision, changes in color vision, or transient obscuration of   vision. Patient denies any diplopia.Patient denies any jaw claudication,   unexplained weight loss, neck pain, myalgia, arthritis pain, or fever.   Patient is currently taking medication to manage hypercholesteremia and   her numbers are in a "good range" patient denies any issues with   hypertension.     Patient is currently living with her husband and 2 dogs. Patient denies   any known exposure to toxins, childhood delays, eye or head trauma.   Patient is currently a smoker and smokes 5 cigarettes a day, patient does   not drink alcohol. Patient denies any non prescription drug use. Patient   has not travelled out of the [place] in the past year.  Last edited by Monte Fantasia, MD on 02/16/2019 11:04 AM. (History)          Current Outpatient Medications:     Omeprazole 20 MG TBDD, Take 20 mg by mouth 2 times daily, Disp: , Rfl:     predniSONE (DELTASONE) 10 MG tablet, Take 10 mg by mouth daily, Disp: , Rfl:     loratadine (CLARITIN) 10 MG tablet, Take 10 mg by mouth daily, Disp: , Rfl:     atorvastatin (LIPITOR) 10 MG tablet, Take 10 mg by mouth daily, Disp: , Rfl:     fluticasone (FLONASE) 50 MCG/ACT nasal spray, 1 spray 2 times daily as needed, Disp: , Rfl: 0    levothyroxine (SYNTHROID, LEVOTHROID) 50 MCG tablet, , Disp: , Rfl: 0    XARELTO 20 MG tablet, , Disp: , Rfl: 0    Budesonide (ENTOCORT EC) 3 MG 24 hr capsule, , Disp: , Rfl: 0    clobetasol (TEMOVATE) 0.05 % external solution, apply to affected area ON SCALP twice a day if needed, Disp: , Rfl: 0    escitalopram (LEXAPRO) 20 MG tablet, Take 20 mg by mouth daily., Disp: , Rfl:     montelukast (SINGULAIR) 10 MG tablet, Take 10 mg by mouth nightly., Disp: , Rfl:     Cyanocobalamin  (VITAMIN B 12 PO), Take  by mouth., Disp: , Rfl:     escitalopram (LEXAPRO) 5 MG tablet, , Disp: , Rfl: 0    fenofibrate (TRICOR) 145 MG tablet, , Disp: , Rfl: 0    alprazolam (XANAX) 0.5 MG tablet, Take 0.5 mg by mouth 2 times daily., Disp: , Rfl:     warfarin (COUMADIN) 5 MG tablet, Take 5 mg by mouth daily. 7.5mg  M-F,  Sat and Sun , Disp: , Rfl:     carbamazepine (TEGRETOL) 200 MG tablet, Take 100 mg by mouth daily., Disp: , Rfl:     valacyclovir (VALTREX) 500 MG tablet, Take 500 mg by mouth daily., Disp: , Rfl:     aspirin 81 MG tablet, , Disp: , Rfl: 0    Omega-3 Fatty Acids (FISH OIL) 1360 MG CAPS, , Disp: , Rfl: 0    Flaxseed, Linseed, (FLAXSEED OIL) 1200 MG CAPS, , Disp: , Rfl: 0    ezetimibe (ZETIA) 10 MG tablet, , Disp: , Rfl: 0    ALPRAZolam (XANAX) 2 MG tablet, , Disp: , Rfl: 0    risedronate (ACTONEL) 150 MG tablet, TAKE 1 TABLET DAILY once per month (Patient not taking: Reported on 12/07/2018), Disp: 90, Rfl: 3  Betadine [povidone iodine]; Contrast dye; and Acid blockers support   Past Medical History:   Diagnosis Date    Clotting disorder     Unspecified cerebral artery occlusion with cerebral infarction       Past Surgical History:   Procedure Laterality Date    APPENDECTOMY  09/2018    BUNIONECTOMY  2004    Right foot    TONSILLECTOMY AND ADENOIDECTOMY  1972      Social History     Tobacco Use   Smoking Status Current Every Day Smoker    Types: Cigarettes   Smokeless Tobacco Never Used      Social History     Substance and Sexual Activity   Alcohol Use Not on file      Social History     Substance and Sexual Activity   Drug Use Not on file     Family History   Problem Relation Age of Onset  Clotting disorder Mother     Cancer Father     Hypertension Father     Diabetes Father     Cancer Maternal Grandmother      Specialty Problems     None           ROS     Positive for: Eyes    Negative for: Constitutional, Gastrointestinal, Neurological, Skin,   Genitourinary,  Musculoskeletal, HENT, Endocrine, Cardiovascular,   Respiratory, Psychiatric, Allergic/Imm, Heme/Lymph    Last edited by Rosana Fret T on 02/14/2019  9:58 AM. (History)        Objective:   Objective  There were no vitals filed for this visit.    Base Eye Exam     Visual Acuity (Snellen - Linear)       Right Left    Dist cc 20/25 20/20 -2    Near sc J1+ J1   Pt forgot GL today  DVA w/ Trial Frames           Tonometry (Tonopen, 10:16 AM)       Right Left    Pressure 16 15          Pupils       Dark Light Shape React APD    Right 5 4 Round Brisk None    Left 5  Round Brisk None          Extraocular Movement       Right Left     Full, Ortho Full, Ortho          Neuro/Psych     Oriented x3:  Yes    Mood/Affect:  Normal          Dilation     Both eyes:  2.5% Phenylephrine, 1.0% Tropicamide @ 10:39 AM            Additional Tests     Amsler       Right Left     Normal Normal          Color       Right Left    Vicki Hancock - Rittler 14/14 13/14          Stereo     Fly:  +          Red Desaturation Test       Right Left      75% 100%            Additional Notes    Brightness:  OD - 100%  OS - 75%     Slit Lamp and Fundus Exam     External Exam     There is normal sensation to light touch and pin prick in all 3 distributions of cranial nerve V.  There is intact orbicularis strength.  Cranial nerves 10-11 are normal.      Palpebral fissure 11 mm 10 mm    Levator 13 mm 13 mm          Exophthalmometry (Base: 117 mm)       Right Left     22 mm 21 mm          Slit Lamp Exam       Right Left    Lids/Lashes Normal structure & position Normal structure & position    Conjunctiva/Sclera Normal bulbar/palpebral, conjunctiva, sclera Normal bulbar/palpebral, conjunctiva, sclera    Cornea Normal epithelium, stroma, endothelium, tear film Normal epithelium, stroma, endothelium, tear film    Anterior Chamber Clear &  deep Clear & deep    Iris Normal shape, size, morphology Normal shape, size, morphology    Lens Posterior chamber intraocular  lens Posterior chamber intraocular lens    Vitreous Clear Clear          Fundus Exam       Right Left    Disc Tilted disc Tilted disc    C/D Ratio .2 V x .4H .2    Macula Normal Normal    Vessels Normal Normal    Periphery Normal Normal              Neurologic Exam     Cranial Nerves      CN I   Right Olfactory: Unable to identify cloves.   Left Olfactory: Unable to identify cloves.     CN VII   Facial expression full, symmetric.               No annotated images are attached to the encounter.          Assessment/Plan:   Assessment Facial pain, atypical  This patient has chronic intermittent knife-like pain mostly on the left side.  This is followed by problems suggestive of expressive aphasia.  The leading diagnoses would be seizure disorder or migraine variant.  The patient will be scheduled for an EEG and return following the EEG for follow-up.

## 2019-02-14 NOTE — Assessment & Plan Note (Signed)
This patient has chronic intermittent knife-like pain mostly on the left side.  This is followed by problems suggestive of expressive aphasia.  The leading diagnoses would be seizure disorder or migraine variant.  The patient will be scheduled for an EEG and return following the EEG for follow-up.

## 2019-02-19 ENCOUNTER — Ambulatory Visit: Payer: Medicare (Managed Care) | Attending: Hematology | Admitting: Hematology

## 2019-02-19 ENCOUNTER — Telehealth: Payer: Self-pay | Admitting: Hematology

## 2019-02-19 ENCOUNTER — Telehealth: Payer: Self-pay | Admitting: Ophthalmology

## 2019-02-19 DIAGNOSIS — Z86711 Personal history of pulmonary embolism: Secondary | ICD-10-CM | POA: Insufficient documentation

## 2019-02-19 DIAGNOSIS — Z8673 Personal history of transient ischemic attack (TIA), and cerebral infarction without residual deficits: Secondary | ICD-10-CM | POA: Insufficient documentation

## 2019-02-19 NOTE — Telephone Encounter (Signed)
Pt is wondering if EEG could be done at the [facility].  Please call to discuss.

## 2019-02-19 NOTE — Progress Notes (Addendum)
Telephone Visit     This is a new patient visit.    Reason for visit: headaches, concern for hypercoaguable state    HPI:  Vicki Hancock is a 68 y.o. female with PMH of Barrett's esophagus, depression, hyperlipidemia, who had previously had bilateral pulmonary emboli in 2009 and possible CVA diagnosed at that time with antiphospholipid syndrome, and also question of FVL with family history.  We have been consulted today as she has had increase in headaches and continues on Xarelto anticoagualation.  Vicki Hancock states that she was switched to Xarelto 1-2 years ago (was on Coumadin during 08/2017 visit with Dr. Everardo Beals), but afterwards began to experience bilateral temporal headaches that she experiences on most days.  They have become worse over this time in a gradual fashion.  She denies any vision changes. She has been on prednisone since at least January, now on 5 mg with tapering.  There had been concerns for temporal arteritis though most recent notes available state she had opted to hold off of a biopsy and is seeing an neuroophthalmologist and neurologist, and is scheduled to have an EEG though she has not had classic seizure symptoms.      She had previously been placed on Coumadin in 2009 after her b/l PE, and diagnosed stroke, though CT head from 10/2009 shows no evidence of prior CVA.  Since on Xarelto she has had no events with bleeding.  Per patients, her mother had FVL mutation and died suddenly at 19 y/o, her cousin died suddently believed due to MI, and her twin sister was also positive for FVL mutation.  She does not know if they are heterozygous or homozygous.  Vicki Hancock herself was tested in 2009 and negative for mutation.  She has had no other clotting events.  She was positive during her hospitalization for PEs with elevated cardiolipin IgA, but also phosphatidyl IgA, IgM antibodies, and slightly elevated for cardiolipin IgM.  Antithrombin III, protein c, protein s was in normal range. Two  other results for cardiolipin IgM were eleveted in 2011, as were phosphatidyl serine antibodies.  Lupus anticoagulant was elevated in 2002, unclear why this was tested.     Patient's problem list, allergies, and medications were reviewed and updated as appropriate. Please see the EHR for full details.    Labs:   On coumadin:  Results for Vicki Hancock, Vicki Hancock (MRN [id number]) as of 02/20/2019 10:37   Ref. Range 07/21/2010 14:30 08/03/2010 15:40   Protime Latest Ref Range: 11.9 - 14.7 sec 26.6 (H)    INR Latest Ref Range: 0.9 - 1.1  2.4 (H)    aPTT Latest Ref Range: 22.3 - 35.3 sec 51.8 (H)    Interp,Spec Coag Unknown  see below   Reviewed By Unknown  see below   Lupus Anticoagulant Latest Ref Range: NEGATIVE   NEG   Anticardiolipin IgG Latest Ref Range: 0 - 14 GPL/mL 4 6   Anticardiolipin IgM Latest Ref Range: 0 - 12 MPL/mL 48 (H) 66 (H)   Phos Serine IgG Latest Ref Range: 0 - 10 U/mL  18 (H)   Phos Serine IgM Latest Ref Range: 0 - 24 U/mL  76 (H)   Phos Serine IgA Latest Ref Range: 0 - 19 U/mL  >100 (H)           Assessment Plan:    Hypercoaguability and history of headache:   Vicki Hancock is a 68 y.o. female with PMH of Barrett's esophagus, depression, hyperlipidemia, who had previously had  bilateral pulmonary emboli in 2009 and possible CVA diagnosed at that time with antiphospholipid syndrome, and also question of FVL with family history.  She is FVL negative from prior mutational testing; no need to repeat, though she is positive for antiphospholipid antibodies.  She remains on anticoagulation.     At this time we favor her continuation on anticoagulation-she likely has antiphospholipid syndrome and also a strong family history of thrombosis.  Though her other family members may have thrombosis history due to Factor V Leiden, this is unlikely to contribute to arterial thrombosis and therefore the patient still may have hereditary influence from family history in addition to APLS.      We discussed with the patient that  coumadin is the preferred agent for antiphospholipid syndrome as studies evaluating DOACs have been inferior (these studies evaluated high risk patients with positve antiglycoprotein, cardiolipin antibodies, and lupus anticoagulant).  The patient does not believe her headaches started at the time of the change from coumadin to Xarelto, therefore we will confirm antiphospholipid antibodies.  Lupus anticoagulant will not be accurate on Xarelto, which we prefer she continue for now.      Plan:   -Antiphospholipid antibodies-anti beta2 glycoprotein and anticardiolipin, also anti phosphotidylserine antibodies as she has tested positive for these before (not routinely tested in screening)  -CBC, CMP for baseline  -We will inform the patient as to her results-if these remain positive and she is agreeable to switching to coumadin, this may be managed by her PCP  -encourage patient to keep appointments with neuro ophthalmologist, neurologist      The plan was discussed with the patient and the patient/patient rep demonstrated understanding to the provider's satisfaction.    Consent was previously obtained from the patient to complete this telephone consult; including the potential for financial liability.    21+ minutes was spent on the phone with the patient, patient representatives, and/or other attendees.       Amy Bodrog [name], MD  Hematology/Oncology Fellow  PGY6  [facility]         Attending attestation   I did personally evaluate the patient by phone. I personally reviewed the case and agree with the resident's/fellow's findings and plan of care as documented with additional details below.    In brief this is a 68 y.o. female with history of bilateral pulmonary embolism (2009) and presumed stroke (2009) at the time, Factor V Leiden (presumed heterozygote), positive anticardiolipin antibody (IgM) and phosphatidylserine antibodies. She had been on dose-adjusted warfarin with no recurrent VTE  until 2018 when she was switched to rivaroxaban. Since then she has developed recurrent headaches that has raised concerns for possible GCA but the work up has not been definitive. There is also family history of FVL mutations of unknown zygosity in her twin sister.     We reviewed the clinical implications of positive antiphospholipid antibodies (aPL) and FVL mutation in the context of clinical history of thrombosis and the risk of recurrent VTE. The rational for secondary prophylaxis to decrease the risk of recurrent VTE was also reviewed. We reviewed the possibility that the persistent headaches could be an idiosyncratic reaction to the rivaroxaban given the timing. In addition if the antiphospholipid antibodies are persistent then she may likely benefit from dose-adjusted warfarin which has a better outcome profile in that regard. We will follow up with the patient once the results are available.     21+ minutes were spent on the phone with the patient,  patient representative, and/or other attendees.    Kirt Boys, MD         Cc: Aaron Edelman, MD  [address], [place] [address]      Michael Litter, MD  [address]  [place] [address]

## 2019-02-20 ENCOUNTER — Encounter: Payer: Self-pay | Admitting: Gastroenterology

## 2019-02-21 DIAGNOSIS — Z8673 Personal history of transient ischemic attack (TIA), and cerebral infarction without residual deficits: Secondary | ICD-10-CM

## 2019-02-21 LAB — COMPREHENSIVE METABOLIC PANEL
ALT: 31 U/L
AST: 26 U/L
Albumin: 4.1 g/dL
Alk Phos: 67 U/L
Anion Gap: 6
Bilirubin,Total: 0.2 mg/dL
CO2: 32 mmol/L
Calcium: 9.8 mg/dL
Chloride: 100 mmol/L
Creatinine: 0.7 mg/dL
GFR,Caucasian: >60 *
Glucose: 90 mg/dL
Lab: 13 mg/dL
Potassium: 4 mmol/L
Sodium: 138 mmol/L
Total Protein: 7 g/dL

## 2019-02-21 LAB — CBC AND DIFFERENTIAL
Baso # K/uL: 0.1 10*3/uL
Basophil %: 0.9 %
Eos # K/uL: 0.1 10*3/uL
Eosinophil %: 0.7 %
Hematocrit: 43.4 %
Hemoglobin: 13.9 g/dL
Lymph # K/uL: 2.03 10*3/uL
Lymphocyte %: 17.6 %
MCV: 102.4 fL
Mono # K/uL: 1.09 10*3/uL
Monocyte %: 9.5 %
Neut # K/uL: 8.18 10*3/uL
Platelets: 260 10*3/uL
RBC: 4.24 MIL/uL
RDW: 13.6 %
Seg Neut %: 71 %
WBC: 11.51 10*3/uL

## 2019-02-26 ENCOUNTER — Other Ambulatory Visit: Payer: Self-pay | Admitting: Ophthalmology

## 2019-02-26 ENCOUNTER — Telehealth: Payer: Self-pay | Admitting: Ophthalmology

## 2019-02-26 DIAGNOSIS — Z961 Presence of intraocular lens: Secondary | ICD-10-CM

## 2019-02-26 DIAGNOSIS — Q142 Congenital malformation of optic disc: Secondary | ICD-10-CM

## 2019-02-26 DIAGNOSIS — G501 Atypical facial pain: Secondary | ICD-10-CM

## 2019-02-26 NOTE — Telephone Encounter (Signed)
Husband wanted to let you know Vicki Hancock did see Dr. Katrinka Blazing, Neuro Opth years ago

## 2019-02-28 ENCOUNTER — Telehealth: Payer: Self-pay | Admitting: Hematology

## 2019-02-28 ENCOUNTER — Telehealth: Payer: Self-pay | Admitting: Oncology

## 2019-02-28 DIAGNOSIS — Z8673 Personal history of transient ischemic attack (TIA), and cerebral infarction without residual deficits: Secondary | ICD-10-CM

## 2019-02-28 LAB — PHOSPHATIDYLSERINE ANTIBODIES
Phos Serine IgA: >100 U/ML — ABNORMAL HIGH (ref <20)
Phos Serine IgG: 11 U/ML — ABNORMAL HIGH (ref <10)
Phos Serine IgM: >100 U/ML — ABNORMAL HIGH (ref <25)

## 2019-02-28 LAB — BETA-2 GLYCOPROTEIN ANTIBODIES
Beta-2 Glyco 1 IgG: <9 SGU
Beta-2 Glyco 1 IgM: 13 SMU

## 2019-02-28 LAB — ANTI-CARDIOLIPIN AB
Anticardiolipin IgG: <14 [GPL'U]/mL
Anticardiolipin IgM: 55 [MPL'U]/mL — ABNORMAL HIGH

## 2019-02-28 NOTE — Telephone Encounter (Signed)
See other phone encounter.  

## 2019-02-28 NOTE — Telephone Encounter (Signed)
Called Ms. Rempel.  Explained her testing is very similar to 2011 and supports antiphospholipid syndrome.  We discussed studies that have shown patients positive for all APLS testing (triple positive) patients had superior outcomes with warfarin over rivaroxaban.  We don't know if she's triple positive as we would not take her off rivaroxaban; she could be, but regardless warfarin at this time is used for APLS.  She is agreeable to return to warfarin.  Patient's questions answered.  We discussed that she is FVL negative, this was based on genetic testing (not APCR) in 2009. Therefore, this does not need to be repeated.    Since she continues to have headaches, and we don't know why, so I would favor bridge to warfarin-she can come off Xarelto and 24 hours after last dose start on therapeutic lovenox until warfarin INR is therapeutic. She again confirmed she is going to see neurology. I discussed this with Dr. Ashok Pall office (spoke with nurse Lorene Dy).      Patti Shorb Letta Median), MD  Hematology/Oncology Fellow  PGY6  [facility]

## 2019-03-01 ENCOUNTER — Encounter: Payer: Self-pay | Admitting: Gastroenterology

## 2019-03-01 NOTE — Telephone Encounter (Signed)
Verbal consent given.

## 2019-03-06 ENCOUNTER — Telehealth: Payer: Self-pay | Admitting: Neurology

## 2019-03-06 ENCOUNTER — Ambulatory Visit: Payer: Medicare (Managed Care) | Attending: Neurology | Admitting: Neurology

## 2019-03-06 DIAGNOSIS — G43809 Other migraine, not intractable, without status migrainosus: Secondary | ICD-10-CM | POA: Insufficient documentation

## 2019-03-06 NOTE — Progress Notes (Signed)
Neurology - Video Consultation Note        Patient:  Vicki Hancock  Patient's DOB:  01/18/1951  Patient's Location:  home  Other Attendees:  spouse    Provider:  Claude Manges, MD  Location of Telemedicine Provider:  Home office    Consent was previously obtained from the patient to complete this Video consult; including informing the patient of the potential for financial liability.    Reason for Visit:  headaches    History        NEUROLOGY Clinic Consult Note    Referring Provider: Dr. Kem Kays    Reason for consult: recurrent headache    History of Present Illness:      Thank you for referring Vicki Hancock for evaluation at my [facility] on 03/06/2019    She is a 68 y.o. R  -handed female referred for evaluation of recurrent predominantly left-sided headaches that have been present for nearly eight years, though with new associated sx for the past 2 years.  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 She had been on dose-adjusted warfarin with no recurrent VTE until 2018, when she was switched to rivaroxaban.    She has actually had mostly left temporal headaches for about eight years, described as sudden onset of a sharp pain just above her ears, which lasts about 1 minute, then becoming dull in nature.  Prior to 2 years ago, these h/as were not associated with any specific neurologic sx, though during the h/a her left temple was tender and swollen.  There was no facial pain with these episodes.   She actually saw Dr. 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  Images not available on pacs).Marland Kitchen  She was briefly treated with tegretol, but she had intolerance to this.  She has been using tlenol for h/a relief with variable efficacy for may years.  These h/a wold occur on average  once every other month. Had esr of 25 in 2011.    For the past two years she has had ongoing similar left temporal sharp pain of sudden onset, again lasting about 1 mnute.  However, these episodes (1 per 2 month frequency) are accompanied by "seconds" of left eye and face drooping and brief word-fiinding difficulty.  There is no face twitching.  The left temple pain is so severe (8/10) that she needs to squat to the floor (or chair), though she has never had LOC.  After about 1 minute, at most, she has residual left temporal achy pain that lasts 24-48 hours.  There is associated left temple tenderness and swellling, as before.  She is not confused, and her speech recovers after a few seconds.  There is no associated ipsilateral eye redness, tearing, rhinorrhea, face pain/swelling.  She does have mild nausea, but no photophobia, phonophobia.  H/As are not accompanied by visual sx, limb weakness/shaking, sensory sx, or gait difficulty.  There are no prodromal sx, such as stereotypic odor, taste, or psychic sx.  These h/a still last 1-2 days, and tylenol offers variable relief.      In this context, she says she also has had "migraine" headaches episodically for the past 10 years. These headaches are described as more generalized, with nausea, photophobia, and photphobia, and are relieved with tylenol.  These events may occur evey  six months.    In between headaches she really has no neurologic sx.  No hx of jaw claudicatication, pulsatile tinnitus, TVOs.  Several providers describe facial pain in her hx, but she does not endorse this to me.    She recently changed her PCP, and when she told him about her recurrent left temporal h/a.  There was concern for GCA, and she was started on prednisone.  No biopsy was done, and I do not have labs.  Steroids are being tapered.  She saw Dr. 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    Her chart mentions hx of prior stroke, but Vicki Hancock is unaware of this.         PMH  Past Medical History:   Diagnosis Date    Clotting disorder     Pulmonary embolism     2009, with DVT, treated with warfarin then xarelto, for hypercoagulability    Unspecified cerebral artery occlusion with cerebral infarction    PFO, valve disease (????)    PSH  Past Surgical History:   Procedure Laterality Date    APPENDECTOMY  09/2018    BUNIONECTOMY  2004    Right foot    TONSILLECTOMY AND ADENOIDECTOMY  1972         Medications:   Reviewed and updated.      ALLERGIES:    Allergies   Allergen Reactions    Betadine [Povidone Iodine] Shortness Of Breath    Contrast Dye Hives    Acid Blockers Support Rash            FH  Family History   Problem Relation Age of Onset    Clotting disorder Mother     Cancer Father     Hypertension Father     Diabetes Father     Cancer Maternal Grandmother    CAD (sister, mother, 11/2 brother)      SOCIAL HISTORY  Social History     Socioeconomic History    Marital status: Married     Spouse name: Not on file    Number of children: Not on file    Years of education: Not on file    Highest education level: Not on file   Tobacco Use    Smoking status: Current Every Day Smoker     Types: Cigarettes    Smokeless tobacco: Never Used   Substance and Sexual Activity    Alcohol use: Not Currently    Drug use: Never    Sexual activity: Not on file   Other Topics Concern    Not on file   Social History Narrative    Not on file       ROS       Review of Systems:    non-contributory          Physical Exam:    She appears generally healthy.  No respiratory distress.    Neurologic:      Mental status: She has grossly normal sensorium, language, cogntion.  She was able to name, repeat, follow all commands, read and repeat stroke card sentences.    CN:  Could not test visual acuity, fundi, or visual fields due to limitations of televisit.  EOMs appeared full, w/o obvious INO.  I did not appreciate nystagmus.   Facial sensation symmetric to LT by self-testing,.No  facial asymmetry on eye closure or smiling.  Shoulder shrug full and symmetric.  Tongue is midline. No dysarthria.    Motor:  There is symmetric antigravity power proximally and distally in all 4 limbs.  No pronator drift.  She stood from chair w/o upper limb assist.  No observed dyskinesias.    Sensory:  No upper limb sensory drift or pseudoathetosis.  Romberg absent.    Gait:  Non-ataxic, steady, narrow-based, with symmetric arm swing.   .    Coord:  No gross finger-to-near nose ataxia.        Labs:            Imaging:            IMPRESSION    Vicki Hancock has chronic (8 years) left temporal headaches that are difficult to classify, as they do not have typical features of either migraine, TAC, nor trigeminal neuralgia (though she also has less frequent generalized headaches with more typical migraine sx).  Over the past 2 years, the left temporal h/a have been associated with extraordinarily brief sx of left ptosi/facial droop (unusual pattern that is not clearly pathophysiologic) and also momentary word-finding difficulty.  The brevity of these symptoms are not typical for migraine aura or TIA, though a focal left hemispheric seizure would be a remote possibility, I suppose (though this would be expected to cause right face sx).   I am not sure what to make of the left HH found by Dr. Kem Kays.    Moving forward, will obtain a brain MRI wo/with contrast and EEG.  Might consider trial of oral CRGP antagonist (newly approved rimegepant) for these events in the future.    Will f/u after above testing.            Thank you again for allowing me to participate in the care of Vicki Hancock .   I can be reached at [email or web address] or [phone] with questions.        The patient and/or patient representative acknowledged and demonstrated understanding of this impression and these recommendations.    75 minutes were spent in this Video consult with the patient, patient representative, and/or other  attendees.    Greater than 50% of the time spent face to face was dedicated to the discussion of the patient's diagnosis(es), prognosis, and plan of care.    Visit Diagnoses          ICD-10-CM ICD-9-CM    1. Migraine variant G43.809 346.20 MR head without and with contrast      EEG       Author: Claude Manges, MD  as of: 03/06/2019  at: 9:10 AM

## 2019-03-06 NOTE — Patient Instructions (Signed)
1.  We will get a brain MRI and EEG for further evaluation.  2.  Keep track of your headaches.  3.  Holding off on treatment right now.

## 2019-03-06 NOTE — Telephone Encounter (Signed)
Patient needed to be walk through how to do zoom.

## 2019-03-08 ENCOUNTER — Encounter: Payer: Self-pay | Admitting: Neurology

## 2019-03-11 ENCOUNTER — Telehealth: Payer: Self-pay

## 2019-03-11 NOTE — Telephone Encounter (Signed)
No auth needed scheduled at [place] on 6.1.20 at 8:30

## 2019-04-04 ENCOUNTER — Other Ambulatory Visit: Payer: Self-pay

## 2019-04-04 ENCOUNTER — Ambulatory Visit: Payer: Medicare (Managed Care) | Admitting: Ophthalmology

## 2019-04-04 ENCOUNTER — Telehealth: Payer: Self-pay

## 2019-04-04 DIAGNOSIS — R569 Unspecified convulsions: Secondary | ICD-10-CM

## 2019-04-04 NOTE — Telephone Encounter (Signed)
Left message to call [phone] to schedule an appointment (for EEG)

## 2019-04-08 ENCOUNTER — Other Ambulatory Visit: Payer: Self-pay | Admitting: Gastroenterology

## 2019-04-08 DIAGNOSIS — G43909 Migraine, unspecified, not intractable, without status migrainosus: Secondary | ICD-10-CM

## 2019-04-08 DIAGNOSIS — G9389 Other specified disorders of brain: Secondary | ICD-10-CM

## 2019-04-10 ENCOUNTER — Telehealth: Payer: Self-pay | Admitting: Ophthalmology

## 2019-04-10 NOTE — Telephone Encounter (Signed)
Questions about details on appointment 04/11/19 because another neurologist needs to know. CBN: [phone]

## 2019-04-10 NOTE — Telephone Encounter (Signed)
I spoke to patient and rescheduled her appointment for after her EEG.  She also mentioned her MRI CD was mailed to wrong provider so will have it forwarded here.

## 2019-04-11 ENCOUNTER — Ambulatory Visit: Payer: Medicare (Managed Care) | Admitting: Ophthalmology

## 2019-04-16 ENCOUNTER — Encounter: Payer: Self-pay | Admitting: Gastroenterology

## 2019-04-16 ENCOUNTER — Other Ambulatory Visit: Payer: Self-pay | Admitting: Neurology

## 2019-04-17 LAB — COVID-19 PCR

## 2019-04-18 ENCOUNTER — Ambulatory Visit: Payer: Medicare (Managed Care) | Attending: Pediatric Neurology

## 2019-04-18 DIAGNOSIS — G43809 Other migraine, not intractable, without status migrainosus: Secondary | ICD-10-CM | POA: Insufficient documentation

## 2019-04-18 DIAGNOSIS — G501 Atypical facial pain: Secondary | ICD-10-CM

## 2019-04-18 NOTE — Procedures (Addendum)
PROCEDURE: Outpatient EEG    Technologist: UO    Ordering provider:  Claude Manges, MD    Date and Time of Service: 04/18/2019    HISTORY:  Vicki Hancock has chronic (8 years) left temporal headaches that are difficult to classify, as they do not have typical features of either migraine, TAC, nor trigeminal neuralgia (though she also has less frequent generalized headaches with more typical migraine sx).  Over the past 2 years, the left temporal h/a have been associated with extraordinarily brief sx of left ptosi/facial droop (unusual pattern that is not clearly pathophysiologic) and also momentary word-finding difficulty.  The brevity of these symptoms are not typical for migraine aura or TIA, though a focal left hemispheric seizure would be a remote possibility.    MEDICATIONS:  no AEDs      Relevant Medications:  Current Outpatient Medications   Medication    Omeprazole 20 MG TBDD    predniSONE (DELTASONE) 10 MG tablet    loratadine (CLARITIN) 10 MG tablet    atorvastatin (LIPITOR) 10 MG tablet    fenofibrate (TRICOR) 145 MG tablet    fluticasone (FLONASE) 50 MCG/ACT nasal spray    levothyroxine (SYNTHROID, LEVOTHROID) 50 MCG tablet    Budesonide (ENTOCORT EC) 3 MG 24 hr capsule    clobetasol (TEMOVATE) 0.05 % external solution    alprazolam (XANAX) 0.5 MG tablet    escitalopram (LEXAPRO) 20 MG tablet    montelukast (SINGULAIR) 10 MG tablet    warfarin (COUMADIN) 5 MG tablet    Cyanocobalamin (VITAMIN B 12 PO)    valacyclovir (VALTREX) 500 MG tablet    Omega-3 Fatty Acids (FISH OIL) 1360 MG CAPS    Flaxseed, Linseed, (FLAXSEED OIL) 1200 MG CAPS    ezetimibe (ZETIA) 10 MG tablet    ALPRAZolam (XANAX) 2 MG tablet    escitalopram (LEXAPRO) 5 MG tablet    risedronate (ACTONEL) 150 MG tablet     No current facility-administered medications for this visit.        TECHNIQUE  The recording was performed on an XLTEK Digital EEG machine. A 21 channel digitally acquired electroencephalogram was  obtained with continuous video monitoring. The 10/20 international system of electrode placement was used and both bipolar and referential electrode montages were monitored. This EEG began at 10:14 and lasted 43 minutes.    DESCRIPTION  Clinical state: waking and drowsiness  Response to hyperventilation: not performed due to covid  Response to photic stimulation: unremarkable     The waking background showed appropriate organization with clearly defined anterior-posterior voltage and frequency gradients. There was a clearly defined occipital rhythm of 8.5-9 Hz, which was symmetric with normal reactivity. Anteriorly, there was an expected pattern of lower voltage, irregular, mixed faster frequencies.    Attenuation of the occipital rhythm accompanied drowsiness, but there were no well-developed sleep spindles to indicate transition to sustained stage 2 sleep.    Throughout the recording, there were no epileptiform discharges, seizures, focal features or significant interhemispheric asymmetries.    INTERPRETATION   This electroencephalogram was within normal limits in the waking and drowsy state(s).    No significant asymmetries of the background activity were noted. No epileptiform activity was present.     Trenton Gammon, MD    I have reviewed the EEG. I have reviewed and agree with the resident's interpretation as documented above.    Electronically signed by Glena Norfolk, MD 5:29 PM 04/18/19.

## 2019-04-23 ENCOUNTER — Telehealth: Payer: Self-pay | Admitting: Neurology

## 2019-04-23 NOTE — Telephone Encounter (Signed)
Received lab results from [facility] for Dr. Merrilee Jansky, placing in [place]

## 2019-04-24 NOTE — Telephone Encounter (Signed)
results moved to Dr. Doylene Canard E-Box folder

## 2019-04-30 ENCOUNTER — Ambulatory Visit: Payer: Medicare (Managed Care) | Admitting: Ophthalmology

## 2019-04-30 ENCOUNTER — Encounter: Payer: Self-pay | Admitting: Ophthalmology

## 2019-04-30 DIAGNOSIS — G501 Atypical facial pain: Secondary | ICD-10-CM

## 2019-04-30 NOTE — Assessment & Plan Note (Signed)
The patient's facial pain has resolved.  Her exam is unremarkable.  She will return as required for follow-up.

## 2019-05-02 NOTE — Progress Notes (Signed)
Subjective:   Subjective  04/30/2019   Chief Complaint   Patient presents with    Follow-up     Trigeminal Nerve Pain     HPI     Follow-up      Additional comments: Trigeminal Nerve Pain              Comments     Vicki FlakeJudith A Symonette is a 68 y.o. female presenting for a 2 month follow up   to monitor her trigeminal nerve pain.  She had an unremarkable MRI at   [facility] and [facility]    Patient reports that her vision has been stable since last visit. Patient   denies any noticeable dimming of vision, changes in color vision,   peripheral vision loss, or transient obscurations of vision. She notes   that her headaches and her nerve pain have improved since last visit. She   switched her blood thinning medication and has seen an improvement. She   notes she is having some dizzy spells that she attributes to the change in   her medication.             Last edited by Monte FantasiaFeldon, Kemp Gomes, MD on 05/02/2019  7:45 PM. (History)          Current Outpatient Medications:     Omeprazole 20 MG TBDD, Take 20 mg by mouth 2 times daily, Disp: , Rfl:     loratadine (CLARITIN) 10 MG tablet, Take 10 mg by mouth daily, Disp: , Rfl:     atorvastatin (LIPITOR) 10 MG tablet, Take 10 mg by mouth daily, Disp: , Rfl:     fluticasone (FLONASE) 50 MCG/ACT nasal spray, 1 spray 2 times daily as needed, Disp: , Rfl: 0    levothyroxine (SYNTHROID, LEVOTHROID) 50 MCG tablet, , Disp: , Rfl: 0    Budesonide (ENTOCORT EC) 3 MG 24 hr capsule, , Disp: , Rfl: 0    clobetasol (TEMOVATE) 0.05 % external solution, apply to affected area ON SCALP twice a day if needed, Disp: , Rfl: 0    escitalopram (LEXAPRO) 20 MG tablet, Take 20 mg by mouth daily., Disp: , Rfl:     montelukast (SINGULAIR) 10 MG tablet, Take 10 mg by mouth nightly., Disp: , Rfl:     warfarin (COUMADIN) 5 MG tablet, Take 5 mg by mouth daily. 7.5mg  M-F, 5mg  Sat and Sun , Disp: , Rfl:     Cyanocobalamin (VITAMIN B 12 PO), Take  by mouth., Disp: , Rfl:     ezetimibe (ZETIA) 10 MG  tablet, , Disp: , Rfl: 0    escitalopram (LEXAPRO) 5 MG tablet, , Disp: , Rfl: 0    predniSONE (DELTASONE) 10 MG tablet, Take 10 mg by mouth daily, Disp: , Rfl:     fenofibrate (TRICOR) 145 MG tablet, , Disp: , Rfl: 0    alprazolam (XANAX) 0.5 MG tablet, Take 0.5 mg by mouth 2 times daily., Disp: , Rfl:     valacyclovir (VALTREX) 500 MG tablet, Take 500 mg by mouth daily., Disp: , Rfl:     Omega-3 Fatty Acids (FISH OIL) 1360 MG CAPS, , Disp: , Rfl: 0    Flaxseed, Linseed, (FLAXSEED OIL) 1200 MG CAPS, , Disp: , Rfl: 0    ALPRAZolam (XANAX) 2 MG tablet, , Disp: , Rfl: 0    risedronate (ACTONEL) 150 MG tablet, TAKE 1 TABLET DAILY once per month (Patient not taking: Reported on 12/07/2018), Disp: 90, Rfl: 3  Betadine [  povidone iodine]; Contrast dye; and Acid blockers support   Past Medical History:   Diagnosis Date    Clotting disorder     Pulmonary embolism     2009, with DVT, treated with warfarin then xarelto, for hypercoagulability    Unspecified cerebral artery occlusion with cerebral infarction       Past Surgical History:   Procedure Laterality Date    APPENDECTOMY  09/2018    BUNIONECTOMY  2004    Right foot    TONSILLECTOMY AND ADENOIDECTOMY  1972      Social History     Tobacco Use   Smoking Status Current Every Day Smoker    Types: Cigarettes   Smokeless Tobacco Never Used      Social History     Substance and Sexual Activity   Alcohol Use Not Currently      Social History     Substance and Sexual Activity   Drug Use Never     Family History   Problem Relation Age of Onset    Clotting disorder Mother     Cancer Father     Hypertension Father     Diabetes Father     Cancer Maternal Grandmother      Specialty Problems     None           ROS     Positive for: Eyes    Negative for: Constitutional, Gastrointestinal, Neurological, Skin,   Genitourinary, Musculoskeletal, HENT, Endocrine, Cardiovascular,   Respiratory, Psychiatric, Allergic/Imm, Heme/Lymph    Last edited by Rosana Fretwen, Clarice T on  04/30/2019  1:43 PM. (History)        Objective:   Objective  There were no vitals filed for this visit.    Base Eye Exam     Visual Acuity (Snellen - Linear)       Right Left    Dist cc 20/25 +1 20/25 +1   Rx in phoropter. Pt forgot GLs           Tonometry (Tonopen, 2:04 PM)       Right Left    Pressure 18 17          Pupils       Dark Light Shape React APD    Right 5 4 Round Brisk None    Left 5 4 Round Brisk None          Extraocular Movement       Right Left     Full, Ortho Full, Ortho          Neuro/Psych     Oriented x3:  Yes    Mood/Affect:  Normal            Slit Lamp and Fundus Exam     External Exam       Right Left    External Normal ocular adnexae, lacrimal gland & drainage, orbits Normal ocular adnexae, lacrimal gland & drainage, orbits          Slit Lamp Exam       Right Left    Lids/Lashes Normal structure & position Normal structure & position    Conjunctiva/Sclera Normal bulbar/palpebral, conjunctiva, sclera Normal bulbar/palpebral, conjunctiva, sclera    Cornea Normal epithelium, stroma, endothelium, tear film Normal epithelium, stroma, endothelium, tear film    Anterior Chamber Clear & deep Clear & deep    Iris Normal shape, size, morphology Normal shape, size, morphology    Lens Posterior chamber intraocular lens Posterior chamber intraocular lens  Vitreous Clear Clear          Fundus Exam       Right Left    Disc Normal size, appearance, nerve fiber layer Normal size, appearance, nerve fiber layer    C/D Ratio 0.5 0.5    Macula Normal Normal    Vessels Normal Normal              Neurologic Exam [place]            No annotated images are attached to the encounter.          Assessment/Plan:   Assessment Facial pain, atypical  The patient's facial pain has resolved.  Her exam is unremarkable.  She will return as required for follow-up.

## 2019-08-22 ENCOUNTER — Ambulatory Visit: Payer: Medicare (Managed Care) | Attending: Neurology

## 2019-08-22 DIAGNOSIS — H8190 Unspecified disorder of vestibular function, unspecified ear: Secondary | ICD-10-CM

## 2019-08-22 DIAGNOSIS — R2689 Other abnormalities of gait and mobility: Secondary | ICD-10-CM | POA: Insufficient documentation

## 2019-08-22 NOTE — Progress Notes (Signed)
Dona was seen on 08/22/2019, in the [facility] for dizziness, imbalance or related complaints.      Please see the media tab for the complete signed VNG test report (to follow) , including eye movement traces, analyzed by Claudie Fisherman. Arby Barrette, MD, PhD..

## 2020-01-28 ENCOUNTER — Other Ambulatory Visit: Payer: Self-pay | Admitting: Pulmonary Disease

## 2020-01-28 DIAGNOSIS — Z23 Encounter for immunization: Secondary | ICD-10-CM

## 2020-03-03 ENCOUNTER — Telehealth: Payer: Self-pay

## 2020-03-03 NOTE — Telephone Encounter (Signed)
New patient referral received from [facility]. Forwarded to Rheum referral coordinator.

## 2020-03-11 ENCOUNTER — Encounter: Payer: Self-pay | Admitting: Gastroenterology

## 2020-03-11 ENCOUNTER — Telehealth: Payer: Self-pay | Admitting: Rheumatology

## 2020-03-11 NOTE — Telephone Encounter (Signed)
Received notes sent from  [facility]. Sent to e-scanning.

## 2020-06-03 ENCOUNTER — Ambulatory Visit
Admission: RE | Admit: 2020-06-03 | Discharge: 2020-06-03 | Disposition: A | Discharge status: Home / Self Care | Payer: Medicare (Managed Care) | Source: Ambulatory Visit | Attending: Rheumatology | Admitting: Rheumatology

## 2020-06-03 ENCOUNTER — Encounter: Payer: Self-pay | Admitting: Rheumatology

## 2020-06-03 ENCOUNTER — Other Ambulatory Visit
Admission: RE | Admit: 2020-06-03 | Discharge: 2020-06-03 | Disposition: A | Discharge status: Home / Self Care | Payer: Medicare (Managed Care) | Source: Ambulatory Visit

## 2020-06-03 ENCOUNTER — Ambulatory Visit: Payer: Medicare (Managed Care) | Admitting: Rheumatology

## 2020-06-03 VITALS — BP 137/75 | HR 87 | Temp 96.8°F | Ht 63.0 in | Wt 135.0 lb

## 2020-06-03 DIAGNOSIS — R7989 Other specified abnormal findings of blood chemistry: Secondary | ICD-10-CM | POA: Insufficient documentation

## 2020-06-03 DIAGNOSIS — R768 Other specified abnormal immunological findings in serum: Secondary | ICD-10-CM

## 2020-06-03 DIAGNOSIS — M19071 Primary osteoarthritis, right ankle and foot: Secondary | ICD-10-CM

## 2020-06-03 DIAGNOSIS — M79673 Pain in unspecified foot: Secondary | ICD-10-CM

## 2020-06-03 LAB — COMPREHENSIVE METABOLIC PANEL
ALT: 15 U/L (ref 0–35)
AST: 17 U/L (ref 0–35)
Albumin: 4 g/dL (ref 3.5–5.2)
Alk Phos: 96 U/L (ref 35–105)
Anion Gap: 10 (ref 7–16)
Bilirubin,Total: <0.2 mg/dL (ref 0.0–1.2)
CO2: 27 mmol/L (ref 20–28)
Calcium: 9.3 mg/dL (ref 8.6–10.2)
Chloride: 102 mmol/L (ref 96–108)
Creatinine: 0.72 mg/dL (ref 0.51–0.95)
GFR,Black: 99 *
GFR,Caucasian: 86 *
Glucose: 120 mg/dL — ABNORMAL HIGH (ref 60–99)
Lab: 10 mg/dL (ref 6–20)
Potassium: 4.2 mmol/L (ref 3.4–4.7)
Sodium: 139 mmol/L (ref 133–145)
Total Protein: 6.9 g/dL (ref 6.3–7.7)

## 2020-06-03 LAB — CBC AND DIFFERENTIAL
Baso # K/uL: 0.1 10*3/uL (ref 0.0–0.1)
Basophil %: 0.5 %
Eos # K/uL: 0.1 10*3/uL (ref 0.0–0.4)
Eosinophil %: 0.9 %
Hematocrit: 40 % (ref 34–45)
Hemoglobin: 13.3 g/dL (ref 11.2–15.7)
IMM Granulocytes #: 0 10*3/uL (ref 0.0–0.0)
IMM Granulocytes: 0.3 %
Lymph # K/uL: 2.3 10*3/uL (ref 1.2–3.7)
Lymphocyte %: 21.9 %
MCH: 33 pg — ABNORMAL HIGH (ref 26–32)
MCHC: 33 g/dL (ref 32–36)
MCV: 100 fL — ABNORMAL HIGH (ref 79–95)
Mono # K/uL: 0.8 10*3/uL (ref 0.2–0.9)
Monocyte %: 7.9 %
Neut # K/uL: 7.2 10*3/uL — ABNORMAL HIGH (ref 1.6–6.1)
Nucl RBC # K/uL: 0 10*3/uL (ref 0.0–0.0)
Nucl RBC %: 0 /100 WBC (ref 0.0–0.2)
Platelets: 343 10*3/uL (ref 160–370)
RBC: 4.1 MIL/uL (ref 3.9–5.2)
RDW: 13.2 % (ref 11.7–14.4)
Seg Neut %: 68.5 %
WBC: 10.6 10*3/uL — ABNORMAL HIGH (ref 4.0–10.0)

## 2020-06-03 LAB — RHEUMATOID FACTOR,QUANT FFT ONLY: Rheumatoid Factor,Quant FFT Only: <10 IU/mL (ref 0–14)

## 2020-06-03 LAB — CRP: CRP: <3 mg/L (ref 0–8)

## 2020-06-03 LAB — SEDIMENTATION RATE, AUTOMATED: Sedimentation Rate: 34 mm/hr — ABNORMAL HIGH (ref 0–30)

## 2020-06-03 NOTE — Patient Instructions (Signed)
You do not have lupus    You could have mild rheumatoid arthritis - lets take closer look at your joints with x-ray then ultrasound     Some more labs today as well

## 2020-06-03 NOTE — Progress Notes (Signed)
[facility] Rheumatology Initial Consultation  Patient: Vicki Hancock   Referring Physician:   Chief complaint/Referral question:  Vicki Hancock pos CCP testing    HPI:   RAHMAH Hancock is a 69 y.o. 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   PMH notable for smoking    Past labs 2020  High Pos cardiolipin IgA and IgM   But normal IgG?   Phosphatidyl serine IgM high pos and IgA high pos  And IgG normal  Beta2 glycoprotein IgA,M,G all neg     Pt details she was referred to our office as her PCP thinks she has fibromyalgia     Initially diffuse whole body pains   But now some more foot pains  Pains in toes, knees, hands   Difficulty walking due to foot pains     Also in context for moving her home recently and lots of home work     No swollen joints   No rashes     Current smoker     Hx of "frozen shoulder" no imaging to make dx  Improved with steroid injection of shoulder     Notable family hx of factor v Leiden in mother    Past Medical/Surgical History:  Past Medical History:   Diagnosis Date    Clotting disorder     Pulmonary embolism     2009, with DVT, treated with warfarin then xarelto, for hypercoagulability    Unspecified cerebral artery occlusion with cerebral infarction      Past Surgical History:   Procedure Laterality Date    APPENDECTOMY  09/2018    BUNIONECTOMY  2004    Right foot    TONSILLECTOMY AND ADENOIDECTOMY  1972       Home Medications:  Prior to Admission medications    Medication Sig Start Date End Date Taking? Authorizing Provider   Omeprazole 20 MG TBDD Take 20 mg by mouth 2 times daily   Yes [provider]   loratadine (CLARITIN) 10 MG tablet Take 10 mg by mouth daily   Yes [provider]   atorvastatin  (LIPITOR) 10 MG tablet Take 10 mg by mouth daily   Yes [provider]   fluticasone (FLONASE) 50 MCG/ACT nasal spray 1 spray 2 times daily as needed 05/19/17  Yes [provider]   levothyroxine (SYNTHROID, LEVOTHROID) 50 MCG tablet  08/09/17  Yes [provider]   escitalopram (LEXAPRO) 20 MG tablet Take 20 mg by mouth daily.   Yes [provider]   montelukast (SINGULAIR) 10 MG tablet Take 10 mg by mouth nightly.   Yes [provider]   warfarin (COUMADIN) 5 MG tablet Take 5 mg by mouth daily. 7.'5mg'$  M-F, '5mg'$  Sat and Sun    Yes [provider]   Cyanocobalamin (VITAMIN B 12 PO) Take  by mouth.   Yes [provider]       Allergies:  Updated in EMR    Family history:   Relevant family history noted in HPI. Detailed family history updated in EMR.    Social history:  Relevant social history noted  in HPI. Detailed family history updated in EMR.    ROS :     Constitutional: Negative.   Musculoskeletal: See HPI for details. Joint pain. No joint swelling.    Skin: Negative.    Eyes: Negative.    ENT: Negative.    Respiratory: Negative.    Cardiac: Negative.    Gastrointestinal: Negative.    Genitourinary: Negative.    Endocrine: Negative.    Allergic/Immunologic: Negative.   Hematologic: Negative.   Neurologic: Negative.    Psychiatric: Negative.    Habits: Tobacco use               Physical examination:  BP 137/75 (BP Location: Left arm, Patient Position: Sitting, Cuff Size: adult)    Pulse 87    Temp 36 C (96.8 F) (Temporal)    Ht 1.6 m ('5\' 3"'$ )    Wt 61.2 kg (135 lb)    BMI 23.91 kg/m   Constitutional   Well-developed and well-nourished.     In no distress and no Cushingoid appearance.    Eyes   Pupils are equal, round, and reactive, EOM are normal and conjunctivae appear normal.      Conjunctivae are normal and the tear pool is normal.     HENT   Normocephalic and atraumatic.     Neck   Neck supple.    Pulmonary   Effort is normal.    Skin   Skin is warm  and dry.     She has no rash, normal nails, no alopecia, no malar rash and no livedo reticularis.    Extremities   There is no edema and no cyanosis of the extremities.   Neuro   She is alert and is oriented.    There is no cranial nerve abnormality and normal coordination.    Psych   There is a normal mood and affect, normal judgement, normal attention/engagement and normal insight.     Musculoskeletal Exam:     Upper and lower extremities There is peripheral joint tenderness, peripheral joint swelling and peripheral joint deformity (bunions bilat 1st MTPs). There is no abnormal or restricted ROM, no pain with ROM, no enthesitis or tenderness at the entheses, no proximal muscle weakness and a normal gait.    Axial exam There is no chest wall tenderness.                  Relevant labs:   Heme   No results for input(s): WBC, HGB, HCT, RBC, PLT in the last 8760 hours.      Chem   No results for input(s): NA, K, CL, CO2, UN, CREAT, GFRC, GFRB, GLU, CA, TP, ALB, ALT, AST, ALK, TB in the last 8760 hours.      Urine  No results for input(s): UCOL, USG, UOH, ULEU, UNIT, UPRO, UBLD, KETONESU, [place], UWBC, URBC, UTPR, CREAU, UCRNC in the last 8760 hours.     Inflammatory Markers  No results found for: CRP  Lab Results   Component Value Date    ESR 25 07/21/2010     No results for input(s): PLT in the last 8760 hours.           Rheum labs:  Lab Results   Component Value Date    ANAS POS (!) 07/21/2010    ANAT 80 (!) 07/21/2010    ANAP HOMOGENEOUS 07/21/2010    B2IGG <9 02/20/2019    B2IGM 13 02/20/2019    ACLIG <14 02/20/2019    ACLIM  55 (H) 02/20/2019    LUPR NEG 08/03/2010    ANCAS NEG 07/21/2010    CK 87 07/21/2010        Prior relevant [facility] imaging studies reviewed:   No results found.    Impression and recommendations:  Vicki Hancock is a 69 y.o. female with unclear history of antiphospholipid antibody syndrome and some new joint pain complaints and now finding of weak positive CCP testing. Some focal tenderness at  her 3rd MTP joint in both feet - and perhaps some swelling at these joints - but not clear on exam. Suggest we evaluate further with repeat CCP testing in our lab - and imaging - xray and then ultrasound of hands and feet - to look for further evidence of possible rheumatoid arthritis.     She has had negative ANA testing an no specific symptoms of systemic lupus - she does not have lupus     As to the antiphospholipid antibody syndrome - seem unusual that she is only positive with IgM and IgA - and has not positive IgA antibodies. Also with notable family hx of factor V leidin. 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       ICD-10-CM ICD-9-CM   1. Cyclic citrullinated peptide (CCP) antibody positive  R79.89 796.9   2. Pains, foot  M79.673 729.5     Patient Instructions   You do not have lupus    You could have mild rheumatoid arthritis - lets take closer look at your joints with x-ray then ultrasound     Some more labs today as well       Luna Kitchens, MD , Rheumatology Attending, 06/03/2020

## 2020-06-04 ENCOUNTER — Ambulatory Visit: Payer: Medicare (Managed Care)

## 2020-06-04 DIAGNOSIS — M7989 Other specified soft tissue disorders: Secondary | ICD-10-CM

## 2020-06-04 DIAGNOSIS — M79642 Pain in left hand: Secondary | ICD-10-CM

## 2020-06-04 DIAGNOSIS — M79672 Pain in left foot: Secondary | ICD-10-CM

## 2020-06-04 DIAGNOSIS — G8929 Other chronic pain: Secondary | ICD-10-CM

## 2020-06-04 DIAGNOSIS — M79641 Pain in right hand: Secondary | ICD-10-CM

## 2020-06-04 DIAGNOSIS — M79671 Pain in right foot: Secondary | ICD-10-CM

## 2020-06-04 LAB — CYCLIC CITRULLINATED PEPTIDE: Cyclic Citrullin Peptide Ab: <0.5 U/mL (ref 0.0–2.9)

## 2020-06-04 LAB — ANTINUCLEAR ANTIBODY SCREEN: ANA Screen: NEGATIVE

## 2020-06-08 NOTE — Procedures (Signed)
1. Musculoskeletal ultrasound evaluation of the right hand and wrist.    Indication: History of pain and swelling.  Evaluate for synovitis or tenosynovitis.    Studies are performed on a GE Logiq E9 unit.  High-frequency ultrasonography at 15 MHz is used.  Gray scale and power Doppler studies are obtained.  Tendons, ligaments, nerve, joint capsule, tendon sheath and bony structures are visualized.    Findings: The wrist is visualized from a dorsal, volar, radial and ulnar aspect.  MCP joints 2 through 5 are visualized from a dorsal aspect.  PIP joints 2 through 5 are visualized from a volar aspect.    No increased fluid collection is seen in the dorsal recesses of radial carpal and midcarpal joints.  No increased fluid collection or synovial proliferation is seen in the examined MCP joints.  No increased fluid collection or synovial proliferation seen in PIP joints.  No bony erosions are seen.  No increased power Doppler signal seen in any of the examined joint areas.    Impression: No inflammatory changes of the wrist and finger joints by ultrasound criteria.    2. Musculoskeletal ultrasound evaluation of the left hand and wrist.    Indication: History of pain and swelling.  Evaluate for synovitis or tenosynovitis.    Studies are performed on a GE Logiq E9 unit.  High-frequency ultrasonography at 15 MHz is used.  Gray scale and power Doppler studies are obtained.  Tendons, ligaments, nerve, joint capsule, tendon sheath and bony structures are visualized.    Findings: The wrist is visualized from a dorsal, volar, radial and ulnar aspect.  MCP joints 2 through 5 are visualized from a dorsal aspect.  PIP joints 2 through 5 are visualized from a volar aspect.    No increased fluid collection is seen in the dorsal recesses of radial carpal and midcarpal joints.  No increased fluid collection or synovial proliferation is seen in the examined MCP joints.  No increased fluid collection or synovial proliferation seen in PIP  joints.  No bony erosions are seen.  No increased power Doppler signal seen in any of the examined joint areas.    Impression: No inflammatory changes of the wrist and finger joints by ultrasound criteria.

## 2020-06-08 NOTE — Procedures (Signed)
1.  Musculoskeletal ultrasound examination of the right forefoot.    Indication: History of pain and swelling.  Evaluate for synovitis or crystal deposition disease.    High-frequency ultrasonography at 15 MHz is used.  Gray scale and power Doppler studies are obtained.  Studies are performed on a GE Logiq E9 unit.  Bony structures, cartilage, joint recess and tendons are visualized.    Findings: Metatarsophalangeal joints 1 through 5 are visualized from a dorsal aspect.  In addition, MTP 1 is visualized from the medial aspect and MTP 5 from a lateral aspect.  MTP joints 1 and 2 are also visualized from a plantar aspect.    No increased fluid collection is seen in the examined joint areas.  Calcification is seen at the dorsal and medial aspect of the first metatarsal head.  Calcification is also seen at the adjacent dorsal proximal phalanx.  No sonographic double contour sign is seen.  Individual power Doppler signals over the joint capsule overlying MCP 1.    Impression: Osteoarthritis of MTP 1.  Small area of hyperemia at the adjacent joint capsule.    2.  Musculoskeletal ultrasound examination of the left forefoot.    Indication: History of pain and swelling.  Evaluate for synovitis or crystal deposition disease.    High-frequency ultrasonography at 15 MHz is used.  Gray scale and power Doppler studies are obtained.  Studies are performed on a GE Logiq E9 unit.  Bony structures, cartilage, joint recess and tendons are visualized.    Findings: Metatarsophalangeal joints 1 through 5 are visualized from a dorsal aspect.  In addition, MTP 1 is visualized from the medial aspect and MTP 5 from a lateral aspect.  MTP joints 1 and 2 are also visualized from a plantar aspect.    No increased fluid collection is seen in the examined joint areas.  Mild edema of the joint capsule at the medial aspect of MTP 1.  No bony erosion is appreciated.  No crystalline deposition is noted.  No sonographic double contour sign is seen.   No abnormal power Doppler signal is noted.    Impression: Mild edema of the joint capsule at the medial aspect of MTP 1.  No other inflammatory changes of the left forefoot by sonographic criteria.

## 2020-06-21 ENCOUNTER — Encounter: Payer: Self-pay | Admitting: Emergency Medicine

## 2020-06-21 ENCOUNTER — Ambulatory Visit
Admission: AD | Admit: 2020-06-21 | Discharge: 2020-06-21 | Disposition: A | Discharge status: Home / Self Care | Payer: Medicare (Managed Care) | Source: Ambulatory Visit

## 2020-06-21 DIAGNOSIS — B370 Candidal stomatitis: Secondary | ICD-10-CM

## 2020-06-21 MED ORDER — NYSTATIN 100000 UNIT/ML MT SUSP *I*
500000.0000 [IU] | Freq: Four times a day (QID) | OROMUCOSAL | 0 refills | Status: AC
Start: 2020-06-21 — End: 2020-07-01

## 2020-06-21 NOTE — Discharge Instructions (Signed)
Start the nystatin swish and swallow  Change your tooth brush and dental cups.

## 2020-06-21 NOTE — UC Provider Note (Signed)
History     Chief Complaint   Patient presents with    Sore Throat     Vicki Hancock presents to the UC with a sore throat and pain with swallowing for the past three days.  No other symptoms.  She has not been exsposed to anyone and has been home for the most part working on her home.  She is a smoker          Medical/Surgical/Family History     Past Medical History:   Diagnosis Date    Clotting disorder     Pulmonary embolism     2009, with DVT, treated with warfarin then xarelto, for hypercoagulability    Unspecified cerebral artery occlusion with cerebral infarction         Patient Active Problem List   Diagnosis Code    Memory Lapses Or Loss R41.3    Facial pain, atypical G50.1            Past Surgical History:   Procedure Laterality Date    APPENDECTOMY  09/2018    BUNIONECTOMY  2004    Right foot    TONSILLECTOMY AND ADENOIDECTOMY  1972     Family History   Problem Relation Age of Onset    Clotting disorder Mother     Cancer Father     Hypertension Father     Diabetes Father     Cancer Maternal Grandmother           Social History     Tobacco Use    Smoking status: Current Every Day Smoker     Types: Cigarettes    Smokeless tobacco: Never Used   Substance Use Topics    Alcohol use: Not Currently    Drug use: Never     Living Situation     Questions Responses    Patient lives with     Homeless     Caregiver for other family member     External Services     Employment     Domestic Violence Risk                 Review of Systems   Review of Systems   HENT: Positive for sore throat.    All other systems reviewed and are negative.      Physical Exam   Triage Vitals  Triage Start: Start, (06/21/20 1355)   First Recorded BP: 141/65, Resp: 17, Temp: 36.3 C (97.4 F), Temp src: TEMPORAL Oxygen Therapy SpO2: 98 %, Oximetry Source: Rt Hand, O2 Device: None (Room air), Heart Rate: 83, (06/21/20 1358)  .  First Pain Reported  0-10 Scale: 7, Pain Location/Orientation: Throat, Pain Descriptors: Sore, (06/21/20  1355)       Physical Exam  Vitals reviewed.   HENT:      Head: Normocephalic.      Right Ear: A middle ear effusion is present.      Left Ear: A middle ear effusion is present.      Mouth/Throat:      Mouth: Oral lesions (white patches to the cheeks and back of the mouth) present.   Cardiovascular:      Rate and Rhythm: Normal rate.      Heart sounds: Normal heart sounds.   Pulmonary:      Effort: Pulmonary effort is normal.   Abdominal:      Palpations: Abdomen is soft.   Musculoskeletal:      Cervical back: Normal range of motion.  Skin:     General: Skin is warm.   Neurological:      General: No focal deficit present.      Mental Status: She is alert.   Psychiatric:         Mood and Affect: Mood normal.          Medical Decision Making        Discussed with the patient to discard tooth brush and denture cup.  Will start on nystatin.  Follow up if symptoms worsen    Final Diagnosis  Final diagnoses:   [B37.0] Oral thrush (Primary)         Marjorie Smolder, NP       I have reviewed nursing documentation, confirmed information with patient, and revised with nursing as necessary.       Chirsty Armistead, Cecil Cranker, NP  06/21/20 1426

## 2020-06-21 NOTE — ED Triage Notes (Signed)
Patient presents to UC with a sore throat for three days.        Triage Note   Vicki Hampshire, LPN

## 2020-07-27 ENCOUNTER — Encounter: Payer: Self-pay | Admitting: Rheumatology

## 2020-07-27 ENCOUNTER — Ambulatory Visit: Payer: Medicare (Managed Care) | Admitting: Rheumatology

## 2020-07-27 VITALS — BP 145/75 | HR 76 | Temp 96.9°F | Ht 63.0 in | Wt 135.0 lb

## 2020-07-27 DIAGNOSIS — R768 Other specified abnormal immunological findings in serum: Secondary | ICD-10-CM

## 2020-07-27 DIAGNOSIS — M1990 Unspecified osteoarthritis, unspecified site: Secondary | ICD-10-CM

## 2020-07-27 NOTE — Progress Notes (Signed)
Rheumatology Follow-Up Visit  Patient: Vicki Hancock  VISIT DIAGNOSIS:   1. False-positive serological test result    2. Osteoarthritis, unspecified osteoarthritis type, unspecified site        HPI:   Vicki Hancock is a 69 y.o. year old female who is seen today in rheumatology for follow-up of unclear history of antiphospholipid antibody syndrome and some new joint pain complaints and now finding of weak positive CCP testing. Some focal tenderness at her 3rd MTP joint in both feet - and perhaps some swelling at these joints - but not clear on exam    Now with [facility] [place] hands late July 2021  No signs of inflammatory arthritis in hands  Small amount of edema and hyperemia around MTP1 of both feet- no erosive disease - no clear synovitis     Repeat labs with neg CCP testing in [facility] lab system  Neg RF  Neg ANA    CRP of 3    PAST MEDICAL HISTORY: Reviewed and updated as needed.   FAMILY HISTORY: Reviewed and updated as needed.   SOCIAL HISTORY: Reviewed and updated as needed.     ROS :   Musculoskeletal: 1st MTP toe pains, muscle cramps sometimes at night in calves   Constitutional: Normal  Skin: Normal  Eyes: Normal  Ears/Nose/Mouth/Throat: Normal  Respiratory: Normal  Cardiac: Normal  Gastrointestinal: Normal  Genitourinary: Normal  Endocrine: Normal  Allergy/Immunologic: Normal  Hematologic: Normal  Neurologic: Normal  Psychiatric: Normal      PHYSICAL EXAMINATION:  Vitals:    07/27/20 1421   BP: 145/75   Pulse: 76   Temp: 36.1 C (96.9 F)   Weight: 61.2 kg (135 lb)   Height: 1.6 m (_0 )     Constitutional   Well-developed and well-nourished.     In no distress and no Cushingoid appearance.    Eyes   Pupils are equal, round, and reactive, EOM are normal and conjunctivae appear normal.      Conjunctivae are normal and the tear pool is normal.     HENT   Normocephalic and atraumatic.     Neck   Neck supple.    Pulmonary   Effort is normal.    Skin   Skin is warm and dry.     She has no rash, normal nails, no  alopecia, no malar rash and no livedo reticularis.    Extremities   There is no edema and no cyanosis of the extremities.   Neuro   She is alert and is oriented.    There is no cranial nerve abnormality and normal coordination.    Psych   There is a normal mood and affect, normal judgement, normal attention/engagement and normal insight.     Musculoskeletal Exam:     Upper and lower extremities There is no peripheral joint tenderness, no peripheral joint swelling, no peripheral joint deformity, no abnormal or restricted ROM, no pain with ROM, no enthesitis or tenderness at the entheses, no proximal muscle weakness and a normal gait.    Axial exam There is no chest wall tenderness.             PRIOR STUDIES:  Recent Labs:      Lab results: 06/03/20  1458   WBC 10.6*   Hemoglobin 13.3   Hematocrit 40   RBC 4.1   Platelets 343           Lab results: 06/03/20  1458   Sodium 139  Potassium 4.2   Chloride 102   CO2 27   UN 10   Creatinine 0.72   GFR,Caucasian 86   GFR,Black 99   Glucose 120*   Calcium 9.3   Total Protein 6.9   Albumin 4.0   ALT 15   AST 17   Alk Phos 96   Bilirubin,Total <0.2       Lab Results   Component Value Date    ESR 34 (H) 06/03/2020     Lab Results   Component Value Date    CRP <3 06/03/2020         Rheum labs:  Lab Results   Component Value Date    ANAS NEG 06/03/2020    ANAT 80 (!) 07/21/2010    ANAP HOMOGENEOUS 07/21/2010    CCP <0.5 06/03/2020    B2IGG <9 02/20/2019    B2IGM 13 02/20/2019    ACLIG <14 02/20/2019    ACLIM 55 (H) 02/20/2019    LUPR NEG 08/03/2010    ANCAS NEG 07/21/2010    CK 87 07/21/2010          Radiology:   -Recent imaging reports reviewed -- please see [facility]Record imaging section for details.  -Recent images reviewed personally.      IMPRESSION:  Vicki Hancock is a 69 y.o. female seen today in rheumatology clinic for follow-up of what seems like a false positive CCP test. Repeat testing in [facility] medicine lab is now negative - both for RF, CCP and ANA. [facility] [place] of her hands and  feet and physical exam in office with on signs of inflammatory arthritis. 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 Recommend conservative management for now - OTC tylenol PRN as needed for pain control.       RECOMMENDATIONS:   Orders Placed This Encounter   No orders placed during this encounter.       There are no Patient Instructions on file for this visit.    Luna Kitchens, MD , Rheumatology Attending, 07/27/2020

## 2020-08-14 ENCOUNTER — Telehealth: Payer: Self-pay | Admitting: Rheumatology

## 2020-08-14 NOTE — Telephone Encounter (Signed)
-----   Message -----   From: Palma Holter, MD   Sent: 07/27/2020  2:50 PM EDT   To: [employer] [facility]     Discharge to PCP - not autoimmune disease

## 8254-01-05 DEATH — deceased
